# Patient Record
Sex: Male | Born: 1970 | Race: Black or African American | Hispanic: No | Marital: Single | Smoking: Current some day smoker
Health system: Southern US, Community
[De-identification: ages and names within clinical notes are randomized; demographics above are authoritative.]

## PROBLEM LIST (undated history)

## (undated) DIAGNOSIS — M549 Dorsalgia, unspecified: Secondary | ICD-10-CM

## (undated) DIAGNOSIS — G8929 Other chronic pain: Secondary | ICD-10-CM

---

## 1998-01-22 ENCOUNTER — Emergency Department (HOSPITAL_COMMUNITY): Admission: EM | Admit: 1998-01-22 | Discharge: 1998-01-22 | Payer: Self-pay | Admitting: Emergency Medicine

## 1998-08-09 ENCOUNTER — Encounter: Payer: Self-pay | Admitting: Emergency Medicine

## 1998-08-09 ENCOUNTER — Emergency Department (HOSPITAL_COMMUNITY): Admission: EM | Admit: 1998-08-09 | Discharge: 1998-08-09 | Payer: Self-pay | Admitting: Emergency Medicine

## 2010-12-01 ENCOUNTER — Inpatient Hospital Stay (INDEPENDENT_AMBULATORY_CARE_PROVIDER_SITE_OTHER)
Admission: RE | Admit: 2010-12-01 | Discharge: 2010-12-01 | Disposition: A | Discharge status: Home / Self Care | Payer: Self-pay | Source: Ambulatory Visit | Attending: Family Medicine | Admitting: Family Medicine

## 2010-12-01 DIAGNOSIS — T6391XA Toxic effect of contact with unspecified venomous animal, accidental (unintentional), initial encounter: Secondary | ICD-10-CM

## 2013-06-20 ENCOUNTER — Ambulatory Visit: Payer: Self-pay

## 2017-01-08 ENCOUNTER — Ambulatory Visit (HOSPITAL_COMMUNITY)
Admission: EM | Admit: 2017-01-08 | Discharge: 2017-01-08 | Disposition: A | Discharge status: Home / Self Care | Payer: Self-pay | Attending: Family Medicine | Admitting: Family Medicine

## 2017-01-08 ENCOUNTER — Encounter (HOSPITAL_COMMUNITY): Payer: Self-pay | Admitting: *Deleted

## 2017-01-08 DIAGNOSIS — L0231 Cutaneous abscess of buttock: Secondary | ICD-10-CM | POA: Insufficient documentation

## 2017-01-08 DIAGNOSIS — L0291 Cutaneous abscess, unspecified: Secondary | ICD-10-CM

## 2017-01-08 MED ORDER — BACITRACIN ZINC 500 UNIT/GM EX OINT
TOPICAL_OINTMENT | CUTANEOUS | Status: AC
  Filled 2017-01-08: qty 0.9

## 2017-01-08 MED ORDER — HYDROCODONE-ACETAMINOPHEN 5-325 MG PO TABS
ORAL_TABLET | ORAL | Status: AC
  Filled 2017-01-08: qty 1

## 2017-01-08 MED ORDER — HYDROCODONE-ACETAMINOPHEN 5-325 MG PO TABS: 1.0000 | ORAL_TABLET | Freq: Four times a day (QID) | ORAL | 0 refills | Status: DC | PRN

## 2017-01-08 MED ORDER — HYDROCODONE-ACETAMINOPHEN 5-325 MG PO TABS
1.0000 | ORAL_TABLET | Freq: Once | ORAL | Status: AC
  Administered 2017-01-08: 1 via ORAL

## 2017-01-08 MED ORDER — CEFTRIAXONE SODIUM 1 G IJ SOLR
1.0000 g | Freq: Once | INTRAMUSCULAR | Status: AC
  Administered 2017-01-08: 1 g via INTRAMUSCULAR

## 2017-01-08 MED ORDER — CEFTRIAXONE SODIUM 1 G IJ SOLR
INTRAMUSCULAR | Status: AC
Start: 2017-01-08 — End: 2017-01-08
  Filled 2017-01-08: qty 10

## 2017-01-08 MED ORDER — CLINDAMYCIN HCL 300 MG PO CAPS: 300.0000 mg | ORAL_CAPSULE | Freq: Three times a day (TID) | ORAL | 0 refills | Status: DC

## 2017-01-08 MED ORDER — LIDOCAINE HCL (PF) 1 % IJ SOLN
INTRAMUSCULAR | Status: AC
  Filled 2017-01-08: qty 2

## 2017-01-08 NOTE — ED Provider Notes (Signed)
[facility]   [id number] 01/08/17 Arrival Time: 1417   SUBJECTIVE:  Gavin Cook is a 46 y.o. male who presents to the urgent care with complaint of a boil on the left cheek that is been present for 1 week and worsening. Describes his pain as an intense pressure, worse when he sits down. Denies any fever or chills, or other systemic symptoms      History reviewed. No pertinent past medical history. History reviewed. No pertinent family history. Social History   Social History  . Marital status: Single    Spouse name: N/A  . Number of children: N/A  . Years of education: N/A   Occupational History  . Not on file.   Social History Main Topics  . Smoking status: Current Some Day Smoker  . Smokeless tobacco: Never Used  . Alcohol use Yes  . Drug use: Unknown  . Sexual activity: Not on file   Other Topics Concern  . Not on file   Social History Narrative  . No narrative on file   No outpatient prescriptions have been marked as taking for the 01/08/17 encounter [facility] Encounter).   No Known Allergies    ROS: As per HPI, remainder of ROS negative.   OBJECTIVE:   Vitals:   01/08/17 1520  BP: (!) 170/105  Pulse: 78  Resp: 18  Temp: 98.6 F (37 C)  TempSrc: Oral  SpO2: 100%     General appearance: alert; no distress Eyes: PERRL; EOMI; conjunctiva normal HENT: normocephalic; atraumatic;  Neck: supple, No JVD noted Lungs: clear to auscultation bilaterally Heart: regular rate and rhythm Abdomen: soft, non-tender; Back: no CVA tenderness, approximately 2 cm abscess noted at the gluteal cleft, with induration noted. No surrounding erythema or cellulitis Extremities: no cyanosis or edema; symmetrical with no gross deformities Skin: warm and dry Neurologic: normal gait; grossly normal Psychological: alert and cooperative; normal mood and affect      Labs:  No results found for this or any previous visit.  Labs Reviewed    AEROBIC/ANAEROBIC CULTURE (SURGICAL/DEEP WOUND)    No results found.     ASSESSMENT & PLAN:  1. Abscess     Meds ordered this encounter  Medications  . HYDROcodone-acetaminophen (NORCO/VICODIN) 5-325 MG per tablet 1 tablet  . cefTRIAXone (ROCEPHIN) injection 1 g  . clindamycin (CLEOCIN) 300 MG capsule    Sig: Take 1 capsule (300 mg total) by mouth 3 (three) times daily.    Dispense:  21 capsule    Refill:  0    Order Specific Question:   Supervising Provider    Answer:   Mardella Layman [id number]  . HYDROcodone-acetaminophen (NORCO/VICODIN) 5-325 MG tablet    Sig: Take 1 tablet by mouth every 6 (six) hours as needed.    Dispense:  10 tablet    Refill:  0    Order Specific Question:   Supervising Provider    Answer:   Mardella Layman [[id number]]    Reviewed expectations re: course of current medical issues. Questions answered. Outlined signs and symptoms indicating need for more acute intervention. Patient verbalized understanding. After Visit Summary given.    Procedures:  Verbal consent obtained. Area over induration cleaned with betadine. Lidocaine 2% with epinephrine used to obtain local anesthesia. The most fluctuant portion of the abscess was incised with a #11 blade scalpel. Abscess cavity explored and evacuated. Loculations broken up with a curved hemostat as best as possible given patient discomfort. Cavity was not packed. It was dressed with  a clean gauze dressing. Minimal bleeding. No complications.  Wound care instructions discussed and given in written format.     Dorena BodoKennard, Omero Kowal, NP 01/08/17 1600

## 2017-01-08 NOTE — Discharge Instructions (Signed)
An incision and drainage is been done, and samples of been sent to the lab for further testing. If any changes in therapy need to be made, we will contact you with the results. I prescribed a medicine called clindamycin, take one tablet every 8 hours for one week. I have also prescribed a medicine for pain called hydrocodone, this medicine is a narcotic, it will cause drowsiness, and it is addictive. Do not take more than what is necessary, do not drink alcohol while taking, and do not operate any heavy machinery while taking this medicine. Keep your wound clean, and dry, covered, change or bandage at least once every day. Monitor for signs and symptoms of infection. The symptoms worsen, or fail to resolve, return to clinic as needed.

## 2017-01-08 NOTE — ED Triage Notes (Addendum)
Boil   On        l   Cheek       X   1   Week         painfull  To  Touch    X   1   Week    Getting  Worse  Pt  Reports  Difficulty    When  Sitting  Down

## 2017-01-14 LAB — AEROBIC/ANAEROBIC CULTURE W GRAM STAIN (SURGICAL/DEEP WOUND): Culture: NEGATIVE

## 2017-10-07 ENCOUNTER — Encounter (HOSPITAL_COMMUNITY): Payer: Self-pay | Admitting: Emergency Medicine

## 2017-10-07 ENCOUNTER — Emergency Department (HOSPITAL_COMMUNITY)
Admission: EM | Admit: 2017-10-07 | Discharge: 2017-10-08 | Disposition: A | Discharge status: Other Acute Inpatient Hospital | Payer: Self-pay | Attending: Emergency Medicine | Admitting: Emergency Medicine

## 2017-10-07 ENCOUNTER — Other Ambulatory Visit: Payer: Self-pay

## 2017-10-07 DIAGNOSIS — F17210 Nicotine dependence, cigarettes, uncomplicated: Secondary | ICD-10-CM | POA: Insufficient documentation

## 2017-10-07 DIAGNOSIS — F1494 Cocaine use, unspecified with cocaine-induced mood disorder: Secondary | ICD-10-CM | POA: Insufficient documentation

## 2017-10-07 DIAGNOSIS — Z79899 Other long term (current) drug therapy: Secondary | ICD-10-CM | POA: Insufficient documentation

## 2017-10-07 DIAGNOSIS — F1420 Cocaine dependence, uncomplicated: Secondary | ICD-10-CM

## 2017-10-07 DIAGNOSIS — F29 Unspecified psychosis not due to a substance or known physiological condition: Secondary | ICD-10-CM

## 2017-10-07 LAB — CBC WITH DIFFERENTIAL/PLATELET
Basophils Absolute: 0 10*3/uL (ref 0.0–0.1)
Basophils Relative: 0 %
EOS PCT: 1 %
Eosinophils Absolute: 0 10*3/uL (ref 0.0–0.7)
HCT: 42.1 % (ref 39.0–52.0)
Hemoglobin: 13.8 g/dL (ref 13.0–17.0)
LYMPHS ABS: 1 10*3/uL (ref 0.7–4.0)
LYMPHS PCT: 12 %
MCH: 25.4 pg — AB (ref 26.0–34.0)
MCHC: 32.8 g/dL (ref 30.0–36.0)
MCV: 77.5 fL — AB (ref 78.0–100.0)
MONO ABS: 1 10*3/uL (ref 0.1–1.0)
Monocytes Relative: 13 %
Neutro Abs: 5.6 10*3/uL (ref 1.7–7.7)
Neutrophils Relative %: 74 %
PLATELETS: 326 10*3/uL (ref 150–400)
RBC: 5.43 MIL/uL (ref 4.22–5.81)
RDW: 13.5 % (ref 11.5–15.5)
WBC: 7.7 10*3/uL (ref 4.0–10.5)

## 2017-10-07 LAB — BASIC METABOLIC PANEL
Anion gap: 9 (ref 5–15)
BUN: 13 mg/dL (ref 6–20)
CO2: 25 mmol/L (ref 22–32)
Calcium: 9.1 mg/dL (ref 8.9–10.3)
Chloride: 107 mmol/L (ref 101–111)
Creatinine, Ser: 1.48 mg/dL — ABNORMAL HIGH (ref 0.61–1.24)
GFR calc Af Amer: >60 mL/min (ref >60)
GFR, EST NON AFRICAN AMERICAN: 56 mL/min — AB (ref >60)
GLUCOSE: 121 mg/dL — AB (ref 65–99)
POTASSIUM: 3.7 mmol/L (ref 3.5–5.1)
Sodium: 141 mmol/L (ref 135–145)

## 2017-10-07 LAB — RAPID URINE DRUG SCREEN, HOSP PERFORMED
Amphetamines: NOT DETECTED
BENZODIAZEPINES: NOT DETECTED
Barbiturates: NOT DETECTED
COCAINE: POSITIVE — AB
Opiates: NOT DETECTED
TETRAHYDROCANNABINOL: NOT DETECTED

## 2017-10-07 LAB — ETHANOL: Alcohol, Ethyl (B): <10 mg/dL (ref <10)

## 2017-10-07 LAB — SALICYLATE LEVEL: Salicylate Lvl: <7 mg/dL (ref 2.8–30.0)

## 2017-10-07 LAB — ACETAMINOPHEN LEVEL

## 2017-10-07 MED ORDER — ACETAMINOPHEN 325 MG PO TABS
650.0000 mg | ORAL_TABLET | Freq: Once | ORAL | Status: AC
  Administered 2017-10-07: 650 mg via ORAL
  Filled 2017-10-07: qty 2

## 2017-10-07 MED ORDER — RISPERIDONE 0.5 MG PO TABS
0.5000 mg | ORAL_TABLET | Freq: Two times a day (BID) | ORAL | Status: DC
  Administered 2017-10-07 – 2017-10-08 (×4): 0.5 mg via ORAL
  Filled 2017-10-07 (×4): qty 1

## 2017-10-07 MED ORDER — GABAPENTIN 100 MG PO CAPS
200.0000 mg | ORAL_CAPSULE | Freq: Two times a day (BID) | ORAL | Status: DC
  Administered 2017-10-07 – 2017-10-08 (×4): 200 mg via ORAL
  Filled 2017-10-07 (×4): qty 2

## 2017-10-07 NOTE — ED Notes (Signed)
Watching tv, pt reports that back pain has improved, but is still 9/10
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
## 2017-10-07 NOTE — ED Notes (Signed)
[id number] Expected date:  Expected time:  Means of arrival:  Comments: Room 18

## 2017-10-07 NOTE — ED Notes (Signed)
Pt ambulatory w/o difficulty, to the bathroom on arrival.
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
## 2017-10-07 NOTE — ED Notes (Signed)
On the phone 

## 2017-10-07 NOTE — ED Notes (Signed)
Patient pleasant and cooperative with care this shift and is compliant with HS medication. Pt denies suicidal or homicidal ideations, but remains delusional and states that he was being followed earlier in the day and that the events he reported earlier were true. Pt with no distress noted at this time.
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
## 2017-10-07 NOTE — ED Notes (Signed)
Pt talking quietly w/ girlfriend, pt reports no change in back pain.  Pt is  aware that the want him to stay and see the psych in the AM, pt is in agreement.
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
## 2017-10-08 ENCOUNTER — Inpatient Hospital Stay (HOSPITAL_COMMUNITY)
Admission: AD | Admit: 2017-10-08 | Discharge: 2017-10-11 | DRG: 897 | Disposition: A | Discharge status: Home / Self Care | Payer: No Typology Code available for payment source | Source: Intra-hospital | Attending: Psychiatry | Admitting: Psychiatry

## 2017-10-08 ENCOUNTER — Encounter (HOSPITAL_COMMUNITY): Payer: Self-pay | Admitting: *Deleted

## 2017-10-08 ENCOUNTER — Other Ambulatory Visit: Payer: Self-pay

## 2017-10-08 DIAGNOSIS — M545 Low back pain: Secondary | ICD-10-CM | POA: Diagnosis present

## 2017-10-08 DIAGNOSIS — Z634 Disappearance and death of family member: Secondary | ICD-10-CM

## 2017-10-08 DIAGNOSIS — F329 Major depressive disorder, single episode, unspecified: Secondary | ICD-10-CM

## 2017-10-08 DIAGNOSIS — G8929 Other chronic pain: Secondary | ICD-10-CM | POA: Diagnosis present

## 2017-10-08 DIAGNOSIS — R450 Nervousness: Secondary | ICD-10-CM

## 2017-10-08 DIAGNOSIS — F419 Anxiety disorder, unspecified: Secondary | ICD-10-CM | POA: Diagnosis present

## 2017-10-08 DIAGNOSIS — Z811 Family history of alcohol abuse and dependence: Secondary | ICD-10-CM | POA: Diagnosis not present

## 2017-10-08 DIAGNOSIS — Z590 Homelessness: Secondary | ICD-10-CM | POA: Diagnosis not present

## 2017-10-08 DIAGNOSIS — F17210 Nicotine dependence, cigarettes, uncomplicated: Secondary | ICD-10-CM | POA: Diagnosis present

## 2017-10-08 DIAGNOSIS — F19950 Other psychoactive substance use, unspecified with psychoactive substance-induced psychotic disorder with delusions: Secondary | ICD-10-CM | POA: Diagnosis not present

## 2017-10-08 DIAGNOSIS — R454 Irritability and anger: Secondary | ICD-10-CM | POA: Diagnosis not present

## 2017-10-08 DIAGNOSIS — F14150 Cocaine abuse with cocaine-induced psychotic disorder with delusions: Secondary | ICD-10-CM | POA: Diagnosis present

## 2017-10-08 DIAGNOSIS — G4700 Insomnia, unspecified: Secondary | ICD-10-CM | POA: Diagnosis present

## 2017-10-08 DIAGNOSIS — Z720 Tobacco use: Secondary | ICD-10-CM

## 2017-10-08 DIAGNOSIS — F14250 Cocaine dependence with cocaine-induced psychotic disorder with delusions: Secondary | ICD-10-CM | POA: Diagnosis present

## 2017-10-08 DIAGNOSIS — R4587 Impulsiveness: Secondary | ICD-10-CM

## 2017-10-08 DIAGNOSIS — Z560 Unemployment, unspecified: Secondary | ICD-10-CM | POA: Diagnosis not present

## 2017-10-08 DIAGNOSIS — F23 Brief psychotic disorder: Secondary | ICD-10-CM

## 2017-10-08 DIAGNOSIS — F1099 Alcohol use, unspecified with unspecified alcohol-induced disorder: Secondary | ICD-10-CM

## 2017-10-08 MED ORDER — NICOTINE 21 MG/24HR TD PT24
21.0000 mg | MEDICATED_PATCH | Freq: Every day | TRANSDERMAL | Status: DC
  Administered 2017-10-09 – 2017-10-11 (×3): 21 mg via TRANSDERMAL
  Filled 2017-10-08 (×6): qty 1

## 2017-10-08 MED ORDER — GABAPENTIN 100 MG PO CAPS
200.0000 mg | ORAL_CAPSULE | Freq: Two times a day (BID) | ORAL | Status: DC
  Administered 2017-10-09 – 2017-10-11 (×5): 200 mg via ORAL
  Filled 2017-10-08 (×3): qty 28
  Filled 2017-10-08 (×3): qty 2
  Filled 2017-10-08: qty 28
  Filled 2017-10-08 (×4): qty 2

## 2017-10-08 MED ORDER — ACETAMINOPHEN 325 MG PO TABS
650.0000 mg | ORAL_TABLET | Freq: Four times a day (QID) | ORAL | Status: DC | PRN
  Administered 2017-10-08 – 2017-10-10 (×2): 650 mg via ORAL
  Filled 2017-10-08 (×2): qty 2

## 2017-10-08 MED ORDER — MAGNESIUM HYDROXIDE 400 MG/5ML PO SUSP: 30.0000 mL | Freq: Every day | ORAL | Status: DC | PRN

## 2017-10-08 MED ORDER — ALUM & MAG HYDROXIDE-SIMETH 200-200-20 MG/5ML PO SUSP: 30.0000 mL | ORAL | Status: DC | PRN

## 2017-10-08 MED ORDER — HYDROXYZINE HCL 25 MG PO TABS
25.0000 mg | ORAL_TABLET | Freq: Three times a day (TID) | ORAL | Status: DC | PRN
  Administered 2017-10-08: 25 mg via ORAL
  Filled 2017-10-08: qty 1
  Filled 2017-10-08: qty 10

## 2017-10-08 MED ORDER — TRAZODONE HCL 50 MG PO TABS
50.0000 mg | ORAL_TABLET | Freq: Every evening | ORAL | Status: DC | PRN
  Filled 2017-10-08: qty 7

## 2017-10-08 MED ORDER — RISPERIDONE 0.5 MG PO TABS
0.5000 mg | ORAL_TABLET | Freq: Two times a day (BID) | ORAL | Status: DC
  Administered 2017-10-09 – 2017-10-10 (×3): 0.5 mg via ORAL
  Filled 2017-10-08 (×7): qty 1

## 2017-10-08 NOTE — ED Notes (Signed)
Pt talking on hallway phone.  
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
## 2017-10-08 NOTE — ED Notes (Signed)
Pt A&O x 3, no distress noted, calm & cooperative, interactive with staff.  Visiting with girlfriend at present.  Monitoring for safety, Q 15 min checks in effect.  [facility].

## 2017-10-08 NOTE — ED Notes (Signed)
[id number].  Pending [facility] transport.

## 2017-10-08 NOTE — Tx Team (Signed)
Initial Treatment Plan 10/08/2017 11:42 PM Gavin Cook ZOX:[id number]    PATIENT STRESSORS: Financial difficulties Loss of family members Marital or family conflict Substance abuse   PATIENT STRENGTHS: Ability for insight Average or above average intelligence Capable of independent living Wellsite geologistCommunication skills General fund of knowledge   PATIENT IDENTIFIED PROBLEMS: Substance Abuse Depression Grieving "I been dealing with death, I don't have nobody in my family left"                     DISCHARGE CRITERIA:  Ability to meet basic life and health needs Improved stabilization in mood, thinking, and/or behavior Verbal commitment to aftercare and medication compliance  PRELIMINARY DISCHARGE PLAN: Attend aftercare/continuing care group Return to previous living arrangement  PATIENT/FAMILY INVOLVEMENT: This treatment plan has been presented to and reviewed with the patient, Gavin Cook, and/or family member, .  The patient and family have been given the opportunity to ask questions and make suggestions.  Gavin Cook, Gavin Cook, [place]RN 10/08/2017, 11:42 PM

## 2017-10-08 NOTE — ED Notes (Signed)
Peer support at bedside 

## 2017-10-08 NOTE — BH Assessment (Signed)
[facility] Progress Note  Per Juanetta BeetsJacqueline Norman, DO, this pt requires psychiatric hospitalization at this time.  Berneice Heinrichina Tate, RN, [facility]; [facility] will be ready to receive pt at 18:00.  Pt has reluctantly signed Voluntary Admission and Consent for Treatment, as well as Consent to Release Information to pt's fiancee, and signed forms have [facility].  Pt's nurse, Morrie Sheldonshley, has been notified, and agrees to send original paperwork along with pt via [facility], and to call report to [phone].  Doylene Canninghomas Sanaii Caporaso, [place] Behavioral Health Coordinator [phone]

## 2017-10-09 DIAGNOSIS — F419 Anxiety disorder, unspecified: Secondary | ICD-10-CM

## 2017-10-09 DIAGNOSIS — Z590 Homelessness: Secondary | ICD-10-CM

## 2017-10-09 DIAGNOSIS — F17210 Nicotine dependence, cigarettes, uncomplicated: Secondary | ICD-10-CM

## 2017-10-09 DIAGNOSIS — Z811 Family history of alcohol abuse and dependence: Secondary | ICD-10-CM

## 2017-10-09 DIAGNOSIS — F14150 Cocaine abuse with cocaine-induced psychotic disorder with delusions: Secondary | ICD-10-CM

## 2017-10-09 DIAGNOSIS — G4700 Insomnia, unspecified: Secondary | ICD-10-CM

## 2017-10-09 DIAGNOSIS — F1099 Alcohol use, unspecified with unspecified alcohol-induced disorder: Secondary | ICD-10-CM

## 2017-10-09 DIAGNOSIS — Z560 Unemployment, unspecified: Secondary | ICD-10-CM

## 2017-10-09 DIAGNOSIS — R454 Irritability and anger: Secondary | ICD-10-CM

## 2017-10-09 DIAGNOSIS — Z599 Problem related to housing and economic circumstances, unspecified: Secondary | ICD-10-CM

## 2017-10-09 NOTE — [facility] Suicide Risk Assessment (Signed)
[facility] INPATIENT:  Family/Significant Other Suicide Prevention Education  Suicide Prevention Education:  Education Completed; Gavin FuchsMaureen Cook (pt's fiance) [phone] has been identified by the patient as the family member/significant other with whom the patient will be residing, and identified as the person(s) who will aid the patient in the event of a mental health crisis (suicidal ideations/suicide attempt).  With written consent from the patient, the family member/significant other has been provided the following suicide prevention education, prior to the and/or following the discharge of the patient.  The suicide prevention education provided includes the following:  Suicide risk factors  Suicide prevention and interventions  National Suicide Hotline telephone number  [facility] assessment telephone number  [facility] Emergency Assistance 911  [facility] and/or Residential Mobile Crisis Unit telephone number  Request made of family/significant other to:  Remove weapons (e.g., guns, rifles, knives), all items previously/currently identified as safety concern.    Remove drugs/medications (over-the-counter, prescriptions, illicit drugs), all items previously/currently identified as a safety concern.  The family member/significant other verbalizes understanding of the suicide prevention education information provided.  The family member/significant other agrees to remove the items of safety concern listed above.   SPE and aftercare reviewed with pt's fiance. She will remove all sharps/knives from pt's belongings. Pt does not have access to firearms. Pt's fiance plans to visit with him this evening and will report any concerns to CSW if any. She is [facility]able [facility] and [facility].   Rona RavensHeather S Mika Anastasi LCSW 10/09/2017, 3:12 PM
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
## 2017-10-09 NOTE — Plan of Care (Signed)
Patient sleeping upon approach. Later on, patient was given breakfast tray and ate in his room. Denies physical pain, denies SI/HI/AVH. Affect bright and pleasant, mood appropriate. Patient did not fill out a self inventory. Patient compliant with medication administration. No questions or concerns about medications. Safety maintained with 15 minute checks. Will continue to monitor.  Problem: Activity: Goal: Interest or engagement in activities will improve Outcome: Progressing   Problem: Coping: Goal: Ability to demonstrate self-control will improve Outcome: Progressing
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
## 2017-10-09 NOTE — BHH Counselor (Signed)
Adult Comprehensive Assessment  Patient ID: Gavin Cook, male   DOB: [birth year], 47 y.o.   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 most family member have died Financial / Lack of resources (include bankruptcy): recent cash influx due to sister dying and leaving him money Housing / Lack of housing: homeless and living in car and motels "when I can afford to get a room." Physical health (include injuries & life threatening diseases): none identified Social relationships: fair-few friends in the community; 3 kids that "I don't like to see when I'm not doing well." fiance Substance abuse: crack cocaine since 47yrs old. "I use it to cope but I can stop if I put my mind to it and that's what I'm planning to do." Bereavement / Loss: mother died several years ago on my birthday so birthdays are hard for me. sister recently passed away "I was very close to her."   Living/Environment/Situation:  Living Arrangements: Spouse/significant other Living conditions (as described by patient or guardian): homeless with fiance. "we stay in the car, on the street or in motels depending on the day." Who else lives in the home?: fiance How long has patient lived in current situation?: several months  What is atmosphere in current home: Temporary, Chaotic  Family History:  Marital status: Long term relationship Long term relationship, how long?: engaged to fiance of 2 1/2 years What types of issues is patient dealing with in  the relationship?: homelessness and financial hardship Additional relationship information: [name], broadcasting/film/video toughing it out with me."  Are you sexually active?: Yes What is your sexual orientation?: heterosexual Has your sexual activity been affected by drugs, alcohol, medication, or emotional stress?: n/a  Does patient have children?: Yes How many children?: 3 How is patient's relationship with their children?: 10, 16, and 59 yo kids. "They are with their grandma. I don't see them as much when I'm not doing well. I don't want them to see me like this."   Childhood History:  By whom was/is the patient raised?: Mother Additional childhood history information: mother was primary caretaker. biological father 'was never around.' Description of patient's relationship with caregiver when they were a child: close to mother "She was my best friend." Patient's description of current relationship with people who raised him/her: mother deceased. no relationship with father How were you disciplined when you got in trouble as a child/adolescent?: n/a  Does patient have siblings?: No Did patient suffer any verbal/emotional/physical/sexual abuse as a child?: No Did patient suffer from severe childhood neglect?: No Has patient ever been sexually abused/assaulted/raped as an adolescent or adult?: No Was the patient ever a victim of a crime or a disaster?: No Witnessed domestic violence?: No Has patient been effected by domestic violence as an adult?: No  Education:  Highest grade of school patient has completed: high school  Currently a Consulting civil engineer?: No Learning disability?: No  Employment/Work Situation:   Employment situation: Unemployed Patient's job has been impacted by current illness: Yes Describe how patient's job has been impacted: unable to hold down employment due to mood  lability and crack abuse What is the longest time patient has a held a job?: few months Where was the patient employed at that time?:  [employer]  Did You Receive Any Psychiatric Treatment/Services While in the [employer]?: No(no [employer] and Companymilitary experience) Are There Guns or Other Weapons in Your Home?: No Are These Weapons Safely Secured?: (n/a)  Financial Resources:   Financial resources: No income Does patient have a Lawyerrepresentative payee or guardian?: No  Alcohol/Substance Abuse:   What has been your use of drugs/alcohol within the last 12 months?: crack cocaine abuse since age 47. "I use to cope with my life but I can stop if I want to and I want to." pt denies other drug or alcohol abuse.  If attempted suicide, did drugs/alcohol play a role in this?: No Alcohol/Substance Abuse Treatment Hx: Denies past history If yes, describe treatment: n/a  Has alcohol/substance abuse ever caused legal problems?: No  Social Support System:   Patient's Community Support System: Fair Museum/gallery exhibitions officerDescribe Community Support System: few friends; fiance is biggest support.  Type of faith/religion: christian How does patient's faith help to cope with current illness?: prayer "I feel like God is wanting me to get on the right path."   Leisure/Recreation:   Leisure and Hobbies: video games; sports; bowling  Strengths/Needs:   What is the patient's perception of their strengths?: "My motivation to get off crack and start fresh. I want to marry my fiance soon." Patient states they can use these personal strengths during their treatment to contribute to their recovery: "follow-up and follow-through" Patient states these barriers may affect/interfere with their treatment: no income currently; homeless Patient states these barriers may affect their return to the community: n/a  Other important information patient would like considered in planning for their treatment: n/a   Discharge Plan:   Currently receiving community mental health services: No Patient states concerns and preferences for aftercare planning are: no income and no insurance Patient states they  will know when they are safe and ready for discharge when: "when I'm back on my meds."  Does patient have access to transportation?: Yes(car) Does patient have financial barriers related to discharge medications?: Yes Patient description of barriers related to discharge medications: no income/no insurance  Will patient be returning to same living situation after discharge?: Yes(back with fiance. "I just inherited money from my sister and plan to go to the beach for my birthday." )  Summary/Recommendations:   Summary and Recommendations (to be completed by the evaluator): Patient is 47yo male [place] Patient presents to the hosptial due to bizarre behavior, paranoia, and delusions. Patient reports crack cocaine abuse (chronic use). He currently denies SI/HI/AVH. Patient is currently unemployed, engaged and has 3 children in care of various family members. Patient has a primary diagnosis of Substance induced mood disorder. Recommendations for patient include: crisis stabilization, therapeutic milieu, encourage group attendance and participation, medication management for detox/mood stabilization, and development of comprehensive mental wellness/sobriety plan. CSW assessing for appropraite referrals.   Rona RavensHeather S Shanan Mcmiller LCSW 10/09/2017 2:49 PM
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
## 2017-10-10 DIAGNOSIS — F19950 Other psychoactive substance use, unspecified with psychoactive substance-induced psychotic disorder with delusions: Secondary | ICD-10-CM

## 2017-10-10 LAB — LIPID PANEL
CHOL/HDL RATIO: 2.5 ratio
CHOLESTEROL: 140 mg/dL (ref 0–200)
HDL: 56 mg/dL (ref >40)
LDL Cholesterol: 75 mg/dL (ref 0–99)
Triglycerides: 43 mg/dL (ref <150)
VLDL: 9 mg/dL (ref 0–40)

## 2017-10-10 LAB — HEMOGLOBIN A1C
Hgb A1c MFr Bld: 4.3 % — ABNORMAL LOW (ref 4.8–5.6)
MEAN PLASMA GLUCOSE: 76.71 mg/dL

## 2017-10-10 MED ORDER — IBUPROFEN 600 MG PO TABS
600.0000 mg | ORAL_TABLET | Freq: Four times a day (QID) | ORAL | Status: DC | PRN
  Administered 2017-10-10 – 2017-10-11 (×2): 600 mg via ORAL
  Filled 2017-10-10: qty 1

## 2017-10-10 MED ORDER — RISPERIDONE 1 MG PO TABS
1.0000 mg | ORAL_TABLET | Freq: Every day | ORAL | Status: DC
  Administered 2017-10-10: 1 mg via ORAL
  Filled 2017-10-10: qty 7
  Filled 2017-10-10 (×2): qty 1
  Filled 2017-10-10: qty 7

## 2017-10-10 NOTE — Progress Notes (Signed)
Recreation Therapy Notes  INPATIENT RECREATION THERAPY ASSESSMENT  Patient Details Name: Gavin Cook MRN: [id number] DOB: 04/08/1971 Today's Date: 10/10/2017       Information Obtained From: Patient  Able to Participate in Assessment/Interview: Yes  Patient Presentation: Alert, Oriented  Reason for Admission (Per Patient): Substance Abuse  Patient Stressors: Other (Comment)(The streets; peer pressure)  Coping Skills:   Write, Sports, TV, Music, Exercise, Meditate, Art, Prayer, Avoidance, Read, Hot Bath/Shower  Leisure Interests (2+):  ConocoPhillipsature - Fishing, Exercise - Walking, Social - Family, Art - Draw, Garment/textile technologistCommunity - Other (Comment)(Do characters at birthday parties)  Frequency of Recreation/Participation: Other (Comment)(Daily)  Awareness of Community Resources:  Yes  Community Resources:  Park, [place], Engineering geologistLibrary  Current Use: Yes  If no, Barriers?:    Expressed Interest in State Street CorporationCommunity Resource Information: No  Enbridge EnergyCounty of Residence:  [place]  Patient Main Form of Transportation: Set designerCar  Patient Strengths:  Lively social person; like to see people smile  Patient Identified Areas of Improvement:  Caring for self  Patient Goal for Hospitalization:  "Stay focused and maintain, secure self before leaving"  Current SI (including self-harm):  No  Current HI:  No  Current AVH: No  Staff Intervention Plan: Group Attendance, Collaborate with Interdisciplinary Treatment Team  Consent to Intern Participation: N/A   Caroll RancherMarjette Nevyn Bossman, LRT/CTRS  Caroll RancherLindsay, Dana Debo A 10/10/2017, 3:19 PM
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
## 2017-10-10 NOTE — Progress Notes (Signed)
DAR NOTE: Pt present with calm affect and bright mood in the unit. Pt has been visible in the dayroom interacting with peers . Pt complained of back pain, took all his meds as scheduled. As per self inventory, pt had a good night sleep, good appetite, normal energy, and good concentration. Pt rate depression at 0, hopeless ness at 0. Pt's safety ensured with 15 minute and environmental checks. Pt currently denies SI/HI and A/V hallucinations. Pt verbally agrees to seek staff if SI/HI or A/VH occurs and to consult with staff before acting on these thoughts. Will continue POC.

## 2017-10-10 NOTE — Progress Notes (Signed)
Recreation Therapy Notes  Date: 6.12.19 Time: 1000 Location: 500 Hall Dayroom  Group Topic: Communication, Team Building, Problem Solving  Goal Area(s) Addresses:  Patient will effectively work with peer towards shared goal.  Patient will identify skill used to make activity successful.  Patient will identify how skills used during activity can be used to reach post d/c goals.   Behavioral Response: Engaged  Intervention: STEM Activity   Activity: In team's, using 20 plastic straws and a long piece of masking tape, patients were to construct a stand alone bridge that could hold the weight of a small puzzle box.   Education: Pharmacist, communityocial Skills, Building control surveyorDischarge Planning.   Education Outcome: Acknowledges education/In group clarification offered/Needs additional education.   Clinical Observations/Feedback: Pt was bright and engaged.  Pt stated the group had to use their "brain and fingers" to untangle the tape.  Pt stated the support system was like the tape because "they hold you together".  Pt also emphasized that "everyone has a part to play" in helping you.     Caroll RancherMarjette Lyrick Worland, LRT/CTRS    Caroll RancherLindsay, Achaia Garlock A 10/10/2017 12:39 PM

## 2017-10-10 NOTE — [facility] Group Notes (Signed)
LCSW Group Therapy Note   10/10/2017 1:15pm   Type of Therapy and Topic:  Group Therapy:  Overcoming Obstacles   Participation Level:  Active   Description of Group:    In this group patients will be encouraged to explore what they see as obstacles to their own wellness and recovery. They will be guided to discuss their thoughts, feelings, and behaviors related to these obstacles. The group will process together ways to cope with barriers, with attention given to specific choices patients can make. Each patient will be challenged to identify changes they are motivated to make in order to overcome their obstacles. This group will be process-oriented, with patients participating in exploration of their own experiences as well as giving and receiving support and challenge from other group members.   Therapeutic Goals: 1. Patient will identify personal and current obstacles as they relate to admission. 2. Patient will identify barriers that currently interfere with their wellness or overcoming obstacles.  3. Patient will identify feelings, thought process and behaviors related to these barriers. 4. Patient will identify two changes they are willing to make to overcome these obstacles:      Summary of Patient Progress   Stayed the entire time, engaged throughout.  Stated he is his biggest oobstacle "when I am not open to new learning, when I think I know it all, when I reject other possibilities."  Spoke at length about the influence of his family; grandfather, father, aunt, and how the family is there for each other through thick and thin.    Therapeutic Modalities:   Cognitive Behavioral Therapy Solution Focused Therapy Motivational Interviewing Relapse Prevention Therapy  Gavin RogueRodney B Britiany Silbernagel, LCSW 10/10/2017 3:59 PM

## 2017-10-10 NOTE — Tx Team (Signed)
Interdisciplinary Treatment and Diagnostic Plan Update  10/10/2017 Time of Session: 8:35 AM  Gavin Cook MRN: [id number]  Principal Diagnosis: <principal problem not specified>  Secondary Diagnoses: Active Problems:   Psychosis ([place])   Current Medications:  Current Facility-Administered Medications  Medication Dose Route Frequency Provider Last Rate Last Dose  . acetaminophen (TYLENOL) tablet 650 mg  650 mg Oral Q6H PRN Ethelene Hal, NP   650 mg at 10/10/17 0752  . alum & mag hydroxide-simeth (MAALOX/MYLANTA) 200-200-20 MG/5ML suspension 30 mL  30 mL Oral Q4H PRN Ethelene Hal, NP      . gabapentin (NEURONTIN) capsule 200 mg  200 mg Oral BID Ethelene Hal, NP   200 mg at 10/10/17 8916  . hydrOXYzine (ATARAX/VISTARIL) tablet 25 mg  25 mg Oral TID PRN Ethelene Hal, NP   25 mg at 10/08/17 2307  . magnesium hydroxide (MILK OF MAGNESIA) suspension 30 mL  30 mL Oral Daily PRN Ethelene Hal, NP      . nicotine (NICODERM CQ - dosed in mg/24 hours) patch 21 mg  21 mg Transdermal Daily Cobos, Myer Peer, MD   21 mg at 10/10/17 0754  . risperiDONE (RISPERDAL) tablet 0.5 mg  0.5 mg Oral BID Ethelene Hal, NP   0.5 mg at 10/10/17 0758  . traZODone (DESYREL) tablet 50 mg  50 mg Oral QHS PRN Ethelene Hal, NP        PTA Medications: Medications Prior to Admission  Medication Sig Dispense Refill Last Dose  . clindamycin (CLEOCIN) 300 MG capsule Take 1 capsule (300 mg total) by mouth 3 (three) times daily. (Patient not taking: Reported on 10/07/2017) 21 capsule 0 Not Taking at Unknown time  . HYDROcodone-acetaminophen (NORCO/VICODIN) 5-325 MG tablet Take 1 tablet by mouth every 6 (six) hours as needed. (Patient not taking: Reported on 10/07/2017) 10 tablet 0 Not Taking at Unknown time    Patient Stressors: Financial difficulties Loss of family members Marital or family conflict Substance abuse  Patient Strengths: Ability for  insight Average or above average intelligence Capable of independent living Curator fund of knowledge  Treatment Modalities: Medication Management, Group therapy, Case management,  1 to 1 session with clinician, Psychoeducation, Recreational therapy.   Physician Treatment Plan for Primary Diagnosis: <principal problem not specified> Long Term Goal(s): Improvement in symptoms so as ready for discharge  Short Term Goals: Ability to identify triggers associated with substance abuse/mental health issues will improve Ability to identify changes in lifestyle to reduce recurrence of condition will improve Ability to maintain clinical measurements within normal limits will improve  Medication Management: Evaluate patient's response, side effects, and tolerance of medication regimen.  Therapeutic Interventions: 1 to 1 sessions, Unit Group sessions and Medication administration.  Evaluation of Outcomes: Progressing  Physician Treatment Plan for Secondary Diagnosis: Active Problems:   Psychosis ([place])   Long Term Goal(s): Improvement in symptoms so as ready for discharge  Short Term Goals: Ability to identify triggers associated with substance abuse/mental health issues will improve Ability to identify changes in lifestyle to reduce recurrence of condition will improve Ability to maintain clinical measurements within normal limits will improve  Medication Management: Evaluate patient's response, side effects, and tolerance of medication regimen.  Therapeutic Interventions: 1 to 1 sessions, Unit Group sessions and Medication administration.  Evaluation of Outcomes: Progressing   RN Treatment Plan for Primary Diagnosis: <principal problem not specified> Long Term Goal(s): Knowledge of disease and therapeutic regimen to maintain health will improve  Short Term Goals: Ability to identify and develop effective coping behaviors will improve and Compliance with prescribed  medications will improve  Medication Management: RN will administer medications as ordered by provider, will assess and evaluate patient's response and provide education to patient for prescribed medication. RN will report any adverse and/or side effects to prescribing provider.  Therapeutic Interventions: 1 on 1 counseling sessions, Psychoeducation, Medication administration, Evaluate responses to treatment, Monitor vital signs and CBGs as ordered, Perform/monitor CIWA, COWS, AIMS and Fall Risk screenings as ordered, Perform wound care treatments as ordered.  Evaluation of Outcomes: Progressing   LCSW Treatment Plan for Primary Diagnosis: <principal problem not specified> Long Term Goal(s): Safe transition to appropriate next level of care at discharge, Engage patient in therapeutic group addressing interpersonal concerns.  Short Term Goals: Engage patient in aftercare planning with referrals and resources  Therapeutic Interventions: Assess for all discharge needs, 1 to 1 time with Social worker, Explore available resources and support systems, Assess for adequacy in community support network, Educate family and significant other(s) on suicide prevention, Complete Psychosocial Assessment, Interpersonal group therapy.  Evaluation of Outcomes: Met   Progress in Treatment: Attending groups: Yes Participating in groups: Yes Taking medication as prescribed: Yes Toleration medication: Yes, no side effects reported at this time Family/Significant other contact made: Yes Patient understands diagnosis: Yes AEB Discussing patient identified problems/goals with staff: Yes Medical problems stabilized or resolved: Yes Denies suicidal/homicidal ideation: Yes Issues/concerns per patient self-inventory: None Other: N/A  New problem(s) identified: None identified at this time.   New Short Term/Long Term Goal(s): "I wanted help with my stress level, but now it is completely gone."   Discharge Plan  or Barriers:   Reason for Continuation of Hospitalization: Paranoia Delusions  Medication stabilization  Estimated Length of Stay: 6/17  Attendees: Patient: Gavin Cook 10/10/2017  8:35 AM  Physician: Maris Berger, MD 10/10/2017  8:35 AM  Nursing: Elesa Massed RN 10/10/2017  8:35 AM  RN Care Manager: Lars Pinks, RN 10/10/2017  8:35 AM  Social Worker: Ripley Fraise 10/10/2017  8:35 AM  Recreational Therapist: Winfield Cunas 10/10/2017  8:35 AM  Other: Norberto Sorenson 10/10/2017  8:35 AM  Other:  10/10/2017  8:35 AM    Scribe for Treatment Team:  Roque Lias LCSW 10/10/2017 8:35 AM

## 2017-10-10 NOTE — Progress Notes (Signed)
The focus of this group is to help patients review their daily goal of treatment and discuss progress on daily workbooks. Pt attended the evening group session and responded to all discussion prompts from the Writer. Pt shared that today was a good day on the unit, the highlight of which was learning about himself and feeling positivity on the unit.  Pt told that he is currently working on trying to learn about the medications he is on so that he can continue to take them upon discharge. He also mentioned feeling ready to discharge.  Pt requested towels from the Writer, but had no other requests from Nursing Staff at this time. His affect was appropriate.

## 2017-10-11 LAB — PROLACTIN: PROLACTIN: 46.4 ng/mL — AB (ref 4.0–15.2)

## 2017-10-11 MED ORDER — TRAZODONE HCL 50 MG PO TABS: 50.0000 mg | ORAL_TABLET | Freq: Every evening | ORAL | 0 refills | Status: DC | PRN

## 2017-10-11 MED ORDER — HYDROXYZINE HCL 25 MG PO TABS: 25.0000 mg | ORAL_TABLET | Freq: Three times a day (TID) | ORAL | 0 refills | Status: DC | PRN

## 2017-10-11 MED ORDER — NICOTINE 21 MG/24HR TD PT24: 21.0000 mg | MEDICATED_PATCH | Freq: Every day | TRANSDERMAL | 0 refills | Status: DC

## 2017-10-11 MED ORDER — RISPERIDONE 1 MG PO TABS: 1.0000 mg | ORAL_TABLET | Freq: Every day | ORAL | 0 refills | Status: DC

## 2017-10-11 MED ORDER — GABAPENTIN 100 MG PO CAPS: 200.0000 mg | ORAL_CAPSULE | Freq: Two times a day (BID) | ORAL | 0 refills | Status: DC

## 2017-10-11 NOTE — Progress Notes (Signed)
  [facility] Dis[facility] :  Will you be returning to the same living situation after discharge:  Yes,  home At discharge, do you have transportation home?: Yes,  wife Do you have the ability to pay for your medications: Yes,  mental health  Release of information consent forms completed and in the chart;  Patient's signature needed at discharge.  Patient to Follow up at: Follow-up Information    [facility].   Specialty:  Behavioral Health Why:  Hospital follow-up on Monday, 10/15/17 at 8:15AM. Please bring: photo ID, social security card, and any proof of income if you have it. Thank you.  Contact information: [address] [place] [address] [phone]           Next level of care provider has access to [facility] Link:no  Safety Planning and Suicide Prevention discussed: Yes,  yes  Have you used any form of tobacco in the last 30 days? (Cigarettes, Smokeless Tobacco, Cigars, and/or Pipes): Yes  Has patient been referred to the Quitline?: Patient refused referral  Patient has been referred for addiction treatment: Pt. refused referral  Ida RogueRodney B Lizania Bouchard, LCSW 10/11/2017, 9:13 AM

## 2017-10-11 NOTE — Plan of Care (Signed)
  Problem: Coping: Goal: Ability to demonstrate self-control will improve Outcome: Progressing Note:  Pt has maintained control of his behavior tonight.    D: Pt was in dayroom upon initial approach.  He presents with appropriate affect and mood.  He is pleasant and respectful towards staff and peers.  He describes his day as "fine, excellent."  His goal is to "get y'all's names for your superior work."  Pt denies SI/HI, hallucinations, and pain.  He has been visible in milieu and he attended evening group.  A: Introduced self to pt.  Actively listened to pt and provided support and encouragement.  Medication administered per order.  Medication education provided.  Q15 minute safety checks maintained.  R: Pt is compliant with medication.  He verbally contracts for safety and reports he will inform staff of needs and concerns.  Will continue to monitor and assess.

## 2017-10-11 NOTE — Progress Notes (Signed)
Pt discharged to lobby. Pt was stable and appreciative at that time. All papers, samples and prescriptions were given and valuables returned. Verbal understanding expressed. Denies SI/HI and A/VH. Pt given opportunity to express concerns and ask questions.  

## 2017-10-11 NOTE — Discharge Summary (Addendum)
Physician Discharge Summary Note  Patient:  Gavin Cook is an 47 y.o., male  MRN:  [id number]  DOB:  [birth year]  Patient phone:  [phone] (home)   Patient address:   [place] [address],  Total Time spent with patient: Greater than 30 minutes  Date of Admission:  10/08/2017  Date of Discharge: 10-11-17  Reason for Admission: Worsening symptoms of paranoia that he was being followed by people after cashing a check and disorganized/dangerous behaviors of brandishing a box cutter due to his paranoia which resulting in accidentally cutting his wife.  Principal Problem: Substance-induced psychotic disorder with delusions [facility])  Discharge Diagnoses: Patient Active Problem List   Diagnosis Date Noted  . 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 Cocaine use disorder, severe, dependence (HCC) [F14.20] 10/07/2017   Past Psychiatric History: See H&P  Past Medical History: History reviewed. No pertinent past medical history. History reviewed. No pertinent surgical history.  Family History: History reviewed. No pertinent family history.  Family Psychiatric  History: See H&P  Social History:  Social History   Substance and Sexual Activity  Alcohol Use Yes     Social History   Substance and Sexual Activity  Drug Use Yes  . Frequency: 1.0 times per week  . Types: Cocaine   Comment: +Coc    Social History   Socioeconomic History  . Marital status: Single    Spouse name: Not on file  . Number of children: Not on file  . Years of education: Not on file  . Highest education level: Not on file  Occupational History  . Not on file  Social Needs  . Financial resource strain: Not on file  . Food insecurity:    Worry: Not on file    Inability: Not on file  . Transportation needs:    Medical: Not on file    Non-medical: Not on file  Tobacco Use  . Smoking status: Current Some Day Smoker  . Smokeless tobacco: Never Used   Substance and Sexual Activity  . Alcohol use: Yes  . Drug use: Yes    Frequency: 1.0 times per week    Types: Cocaine    Comment: +Coc  . Sexual activity: Yes  Lifestyle  . Physical activity:    Days per week: Not on file    Minutes per session: Not on file  . Stress: Not on file  Relationships  . Social connections:    Talks on phone: Not on file    Gets together: Not on file    Attends religious service: Not on file    Active member of club or organization: Not on file    Attends meetings of clubs or organizations: Not on file    Relationship status: Not on file  Other Topics Concern  . Not on file  Social History Narrative  . Not on file   Hospital Course: (Per Md's discharge SRA): Gavin Cook is a 47 y/o M without formal psychiatric history who was admitted from ED with worsening symptoms of paranoia that he was being followed by people after cashing a check and disorganized/dangerous behaviors of brandishing a box cutter due to his paranoia which resulting in accidentally cutting his wife. Pt's symptoms were in the context of intoxication on cocaine. He was transferred to [facility] for additional treatment and evaluation. He was started on risperdal and dose was titrated up during his stay. He also was started on gabapentin and vistaril for anxiety as well as trazodone  for as needed treatment of sleep. Pt has been reporting incremental improvement of his presenting symptoms.  Today upon evaluation, pt shares, "I'm doing absolutely well and swell." Pt denies any specific concerns. His appetite is good. He is sleeping well. He denies other physical complaints. He denies SI/HI/AH/VH. He denies paranoia. He is tolerating his medications without difficulty or side effect. He is in agreement to continue his current regimen without changes. He is in agreement to have follow up at [facility]. Discussed with patient about risks of continued substance use of cocaine and ecstasy including risk  of damage to his heart to which pt reports his family has a genetic predisposition of heart disease, and pt verbalized good understanding. He states that he plans to avoid all illicit substance use after discharge. He was able to engage in safety planning including plan to return to [facility] or contact emergency services if he feels unable to maintain his own safety or the safety of others. Pt had no further questions, comments, or concerns.  Plan Of Care/Follow-up recommendations:   -Discharge to outpatient level of care as noted below.  -Substance-induced psychotic disorder with delusions (MDMA, cocaine) -Continue risperdal 1mg  po qhs  -Anxiety -Continue vistaril 25mg  po q8h prn anxiety -Continue gabapentin 200mg  po BID  -Insomnia -Continue trazodone 50mg  po qhs prn insomnia  Activity:  as tolerated  Physical Findings: AIMS: Facial and Oral Movements Muscles of Facial Expression: None, normal Lips and Perioral Area: None, normal Jaw: None, normal Tongue: None, normal,Extremity Movements Upper (arms, wrists, hands, fingers): None, normal Lower (legs, knees, ankles, toes): None, normal, Trunk Movements Neck, shoulders, hips: None, normal, Overall Severity Severity of abnormal movements (highest score from questions above): None, normal Incapacitation due to abnormal movements: None, normal Patient's awareness of abnormal movements (rate only patient's report): No Awareness, Dental Status Current problems with teeth and/or dentures?: No Does patient usually wear dentures?: No  CIWA:    COWS:     Musculoskeletal: Strength & Muscle Tone: within normal limits Gait & Station: normal Patient leans: N/A  Psychiatric Specialty Exam: Physical Exam  Constitutional: He appears well-developed.  HENT:  Head: Normocephalic.  Eyes: Pupils are equal, round, and reactive to light.  Neck: Normal range of motion.  Cardiovascular: Normal  rate.  Respiratory: Effort normal.  GI: Soft.  Genitourinary:  Genitourinary Comments: Deferred  Musculoskeletal: Normal range of motion.  Neurological: He is alert.  Skin: Skin is warm.    Review of Systems  Constitutional: Negative.   HENT: Negative.   Eyes: Negative.   Respiratory: Negative.   Cardiovascular: Negative.   Gastrointestinal: Negative.   Genitourinary: Negative.   Musculoskeletal: Negative.   Skin: Negative.   Neurological: Negative.   Endo/Heme/Allergies: Negative.   Psychiatric/Behavioral: Positive for depression (Stabilized with medication prior to discharge) and substance abuse (Hx, Cocaine use disorder (stable)). Negative for hallucinations, memory loss and suicidal ideas. The patient has insomnia (Stabilized with medication prior to discharge). The patient is not nervous/anxious.     Blood pressure 140/87, pulse 86, temperature 98.6 F (37 C), temperature source Oral, resp. rate 16, height 5\' 7"  (1.702 m), weight 62.6 kg (138 lb).Body mass index is 21.61 kg/m.  See Md's SRA   Have you used any form of tobacco in the last 30 days? (Cigarettes, Smokeless Tobacco, Cigars, and/or Pipes): Yes  Has this patient used any form of tobacco in the last 30 days? (Cigarettes, Smokeless Tobacco, Cigars, and/or Pipes): Yes, an FDA-approved tobacco cessation medication was offered at discharge.  Blood Alcohol level:  Lab Results  Component Value Date   ETH <10 10/07/2017   Metabolic Disorder Labs:  Lab Results  Component Value Date   HGBA1C 4.3 (L) 10/10/2017   MPG 76.71 10/10/2017   Lab Results  Component Value Date   PROLACTIN 46.4 (H) 10/10/2017   Lab Results  Component Value Date   CHOL 140 10/10/2017   TRIG 43 10/10/2017   HDL 56 10/10/2017   CHOLHDL 2.5 10/10/2017   VLDL 9 10/10/2017   LDLCALC 75 10/10/2017   See Psychiatric Specialty Exam and Suicide Risk Assessment completed by Attending Physician prior to discharge.  Discharge destination:   Home  Is patient on multiple antipsychotic therapies at discharge:  No   Has Patient had three or more failed trials of antipsychotic monotherapy by history:  No  Recommended Plan for Multiple Antipsychotic Therapies: NA  Allergies as of 10/11/2017   No Known Allergies     Medication List    STOP taking these medications   clindamycin 300 MG capsule Commonly known as:  CLEOCIN   HYDROcodone-acetaminophen 5-325 MG tablet Commonly known as:  NORCO/VICODIN     TAKE these medications     Indication  gabapentin 100 MG capsule Commonly known as:  NEURONTIN Take 2 capsules (200 mg total) by mouth 2 (two) times daily. For agitation  Indication:  Agitation   hydrOXYzine 25 MG tablet Commonly known as:  ATARAX/VISTARIL Take 1 tablet (25 mg total) by mouth 3 (three) times daily as needed for anxiety.  Indication:  Feeling Anxious   nicotine 21 mg/24hr patch Commonly known as:  NICODERM CQ - dosed in mg/24 hours Place 1 patch (21 mg total) onto the skin daily. (May buy from over the counter): For smoking cessation Start taking on:  10/12/2017  Indication:  Nicotine Addiction   risperiDONE 1 MG tablet Commonly known as:  RISPERDAL Take 1 tablet (1 mg total) by mouth at bedtime. For mood control  Indication:  Psychosis, Mood control   traZODone 50 MG tablet Commonly known as:  DESYREL Take 1 tablet (50 mg total) by mouth at bedtime as needed for sleep.  Indication:  Trouble Sleeping      Follow-up Information    [facility] Follow up.   Specialty:  Behavioral Health Why:  Hospital follow-up on Monday, 10/15/17 at 8:15AM. Please bring: photo ID, social security card, and any proof of income if you have it. Thank you.  Contact informationElpidio Eric: [address] [place] [address] [phone]          Follow-up recommendations: Activity:  As tolerated Diet: As recommended by your primary care doctor. Keep all scheduled follow-up appointments as recommended.   Comments:  Patient is instructed prior to discharge to: Take all medications as prescribed by his/her mental healthcare provider. Report any adverse effects and or reactions from the medicines to his/her outpatient provider promptly. Patient has been instructed & cautioned: To not engage in alcohol and or illegal drug use while on prescription medicines. In the event of worsening symptoms, patient is instructed to call the crisis hotline, 911 and or go to the nearest ED for appropriate evaluation and treatment of symptoms. To follow-up with his/her primary care provider for your other medical issues, concerns and or health care needs.   Signed: Armandina StammerAgnes Nwoko, NP, PMHNP, FNP-BC 10/11/2017, 9:33 AM   Patient seen, Suicide Assessment Completed.  Disposition Plan Reviewed

## 2017-10-11 NOTE — Plan of Care (Signed)
Pt was able to identify positive coping skills to use post d/c at completion of recreation therapy group sessions.   Zuhair Lariccia, LRT/CTRS 

## 2017-10-11 NOTE — [facility] Suicide Risk Assessment (Signed)
[facility]   Principal Problem: Substance-induced psychotic disorder with delusions [facility](HCC) Discharge Diagnoses:  Patient Active Problem List   Diagnosis Date Noted  . 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 Cocaine use disorder, severe, dependence (HCC) [F14.20] 10/07/2017    Total Time spent with patient: 30 minutes  Musculoskeletal: Strength & Muscle Tone: within normal limits Gait & Station: normal Patient leans: N/A  Psychiatric Specialty Exam: Review of Systems  Constitutional: Negative for chills and fever.  Respiratory: Negative for cough and shortness of breath.   Cardiovascular: Negative for chest pain.  Gastrointestinal: Negative for abdominal pain, heartburn, nausea and vomiting.  Psychiatric/Behavioral: Negative for depression, hallucinations and suicidal ideas. The patient is not nervous/anxious and does not have insomnia.     Blood pressure 140/87, pulse 86, temperature 98.6 F (37 C), temperature source Oral, resp. rate 16, height 5\' 7"  (1.702 m), weight 62.6 kg (138 lb).Body mass index is 21.61 kg/m.  General Appearance: Casual and Fairly Groomed  Patent attorneyye Contact::  Good  Speech:  Clear and Coherent and Normal Rate  Volume:  Normal  Mood:  Euthymic  Affect:  Appropriate and Congruent  Thought Process:  Coherent and Goal Directed  Orientation:  Full (Time, Place, and Person)  Thought Content:  Logical  Suicidal Thoughts:  No  Homicidal Thoughts:  No  Memory:  Immediate;   Fair Recent;   Fair Remote;   Fair  Judgement:  Fair  Insight:  Lacking  Psychomotor Activity:  Normal  Concentration:  Good  Recall:  FiservFair  Fund of Knowledge:Good  Language: Fair  Akathisia:  No  Handed:    AIMS (if indicated):     Assets:  Communication Skills Resilience Social Support  Sleep:  Number of Hours: 5  Cognition: WNL  ADL's:  Intact   Mental Status Per Nursing Assessment::   On Admission:  NA  Demographic  Factors:  Male and Low socioeconomic status  Loss Factors: Financial problems/change in socioeconomic status  Historical Factors: Impulsivity  Risk Reduction Factors:   Positive social support, Positive therapeutic relationship and Positive coping skills or problem solving skills  Continued Clinical Symptoms:  Severe Anxiety and/or Agitation Alcohol/Substance Abuse/Dependencies  Cognitive Features That Contribute To Risk:  None    Suicide Risk:  Minimal: No identifiable suicidal ideation.  Patients presenting with no risk factors but with morbid ruminations; may be classified as minimal risk based on the severity of the depressive symptoms  Follow-up Information    [facility] Follow up.   Specialty:  Behavioral Health Why:  Hospital follow-up on Monday, 10/15/17 at 8:15AM. Please bring: photo ID, social security card, and any proof of income if you have it. Thank you.  Contact information: [address] [place] [address] [phone]         Subjective Data:  Gavin Cook is a 47 y/o M without formal psychiatric history who was admitted from ED with worsening symptoms of paranoia that he was being followed by people after cashing a check and disorganized/dangerous behaviors of brandishing a box cutter due to his paranoia which resulting in accidentally cutting his wife. Pt's symptoms were in the context of intoxication on cocaine. [facility] He was started on risperdal and dose was titrated up during his stay. He also was started on gabapentin and vistaril for anxiety as well as trazodone for as needed treatment of sleep. Pt has been reporting incremental improvement of his  presenting symptoms.  Today upon evaluation, pt shares, "I'm doing absolutely well and swell." Pt denies any specific concerns. His appetite is good. He is sleeping well. He denies other physical complaints. He denies SI/HI/AH/VH. He denies  paranoia. He is tolerating his medications without difficulty or side effect. He is in agreement to continue his current regimen without changes. He is in agreement to have follow up at [facility]. Discussed with patient about risks of continued substance use of cocaine and ecstasy including risk of damage to his heart to which pt reports his family has a genetic predisposition of heart disease, and pt verbalized good understanding. He states that he plans to avoid all illicit substance use after discharge. He was able to engage in safety planning including plan to return to [facility] or contact emergency services if he feels unable to maintain his own safety or the safety of others. Pt had no further questions, comments, or concerns.   Plan Of Care/Follow-up recommendations:   -Discharge to outpatient level of care  -Substance-induced psychotic disorder with delusions (MDMA, cocaine)             -Continue risperdal 1mg  po qhs  -Anxiety             -Continue vistaril 25mg  po q8h prn anxiety             -Continue gabapentin 200mg  po BID  -Insomnia              -Continue trazodone 50mg  po qhs prn insomnia  Activity:  as tolerated Diet:  normal Tests:  NA Other:  see above for DC plan  Micheal Likens, MD 10/11/2017, 8:47 AM

## 2017-10-11 NOTE — Progress Notes (Signed)
Recreation Therapy Notes  Date: 6.13.19 Time: 1000 Location: 500 [address] Dayroom  Group Topic: Coping Skills  Goal Area(s) Addresses:  Patient will be able to identify positive coping skills. Patient will be able to identify benefits of using coping skills post d/c.  Behavioral Response: Engaged  Intervention: Mind map    Activity: Mind map.  LRT and patients filled in the first eight boxes of the mind map (job, pain, impulses, depression, transportation, medication, anger/aggression and family) together.  Patients then identified coping skills for each situation individually before reconvening as a group.  Education: PharmacologistCoping Skills, Building control surveyorDischarge Planning.   Education Outcome: Acknowledges understanding/In group clarification offered/Needs additional education.   Clinical Observations/Feedback: Pt was active and engaged in group.  Some of the coping skills pt identified were drinking water and the spa for pain; exercise for impulses; call the doctor and stay stable for depression; get a bike or scooter for transportation; meditation for medication; cry for anger/aggression; hugs and occupy kids for family and strive for promotions for job.     Caroll RancherMarjette Airi Copado, LRT/CTRS    Lillia AbedLindsay, Rowyn Spilde A 10/11/2017 10:57 AM

## 2017-12-23 ENCOUNTER — Encounter (HOSPITAL_COMMUNITY)
Admission: EM | Disposition: A | Discharge status: Home / Self Care | Payer: Self-pay | Source: Home / Self Care | Attending: Emergency Medicine

## 2017-12-23 ENCOUNTER — Emergency Department (HOSPITAL_COMMUNITY): Payer: No Typology Code available for payment source | Admitting: Anesthesiology

## 2017-12-23 ENCOUNTER — Emergency Department (HOSPITAL_COMMUNITY): Payer: No Typology Code available for payment source

## 2017-12-23 ENCOUNTER — Observation Stay (HOSPITAL_COMMUNITY)
Admission: EM | Admit: 2017-12-23 | Discharge: 2017-12-25 | Disposition: A | Discharge status: Home / Self Care | Payer: No Typology Code available for payment source | Attending: Otolaryngology | Admitting: Otolaryngology

## 2017-12-23 ENCOUNTER — Encounter (HOSPITAL_COMMUNITY): Payer: Self-pay | Admitting: Emergency Medicine

## 2017-12-23 DIAGNOSIS — S0240CA Maxillary fracture, right side, initial encounter for closed fracture: Secondary | ICD-10-CM

## 2017-12-23 DIAGNOSIS — F17200 Nicotine dependence, unspecified, uncomplicated: Secondary | ICD-10-CM | POA: Diagnosis not present

## 2017-12-23 DIAGNOSIS — S01511A Laceration without foreign body of lip, initial encounter: Principal | ICD-10-CM

## 2017-12-23 DIAGNOSIS — S0121XA Laceration without foreign body of nose, initial encounter: Secondary | ICD-10-CM | POA: Insufficient documentation

## 2017-12-23 DIAGNOSIS — S01411A Laceration without foreign body of right cheek and temporomandibular area, initial encounter: Secondary | ICD-10-CM | POA: Insufficient documentation

## 2017-12-23 DIAGNOSIS — S0181XA Laceration without foreign body of other part of head, initial encounter: Secondary | ICD-10-CM

## 2017-12-23 DIAGNOSIS — S129XXA Fracture of neck, unspecified, initial encounter: Secondary | ICD-10-CM

## 2017-12-23 DIAGNOSIS — S12600A Unspecified displaced fracture of seventh cervical vertebra, initial encounter for closed fracture: Secondary | ICD-10-CM | POA: Insufficient documentation

## 2017-12-23 HISTORY — DX: Dorsalgia, unspecified: M54.9

## 2017-12-23 HISTORY — PX: FACIAL LACERATION REPAIR: SHX6589

## 2017-12-23 HISTORY — DX: Other chronic pain: G89.29

## 2017-12-23 LAB — BASIC METABOLIC PANEL
ANION GAP: 12 (ref 5–15)
BUN: 9 mg/dL (ref 6–20)
CALCIUM: 8.5 mg/dL — AB (ref 8.9–10.3)
CO2: 24 mmol/L (ref 22–32)
Chloride: 106 mmol/L (ref 98–111)
Creatinine, Ser: 1.35 mg/dL — ABNORMAL HIGH (ref 0.61–1.24)
Glucose, Bld: 84 mg/dL (ref 70–99)
POTASSIUM: 3.7 mmol/L (ref 3.5–5.1)
SODIUM: 142 mmol/L (ref 135–145)

## 2017-12-23 LAB — CBC
HEMATOCRIT: 42.2 % (ref 39.0–52.0)
HEMOGLOBIN: 13.2 g/dL (ref 13.0–17.0)
MCH: 24.6 pg — ABNORMAL LOW (ref 26.0–34.0)
MCHC: 31.3 g/dL (ref 30.0–36.0)
MCV: 78.7 fL (ref 78.0–100.0)
Platelets: 291 10*3/uL (ref 150–400)
RBC: 5.36 MIL/uL (ref 4.22–5.81)
RDW: 13.4 % (ref 11.5–15.5)
WBC: 10.3 10*3/uL (ref 4.0–10.5)

## 2017-12-23 LAB — ETHANOL: ALCOHOL ETHYL (B): 47 mg/dL — AB (ref <10)

## 2017-12-23 SURGERY — REPAIR, LACERATION, FACE
Anesthesia: General | Site: Face

## 2017-12-23 MED ORDER — BACITRACIN ZINC 500 UNIT/GM EX OINT
TOPICAL_OINTMENT | CUTANEOUS | Status: AC
  Filled 2017-12-23: qty 28.35

## 2017-12-23 MED ORDER — SODIUM CHLORIDE 0.9 % IV SOLN
INTRAVENOUS | Status: DC | PRN
  Administered 2017-12-23: 500 mL via INTRAVENOUS

## 2017-12-23 MED ORDER — LACTATED RINGERS IV SOLN
INTRAVENOUS | Status: DC | PRN
  Administered 2017-12-23 – 2017-12-24 (×2): via INTRAVENOUS

## 2017-12-23 MED ORDER — FENTANYL CITRATE (PF) 250 MCG/5ML IJ SOLN
INTRAMUSCULAR | Status: AC
  Filled 2017-12-23: qty 5

## 2017-12-23 MED ORDER — MIDAZOLAM HCL 2 MG/2ML IJ SOLN
INTRAMUSCULAR | Status: AC
  Filled 2017-12-23: qty 2

## 2017-12-23 MED ORDER — CEFAZOLIN SODIUM-DEXTROSE 1-4 GM/50ML-% IV SOLN
1.0000 g | Freq: Once | INTRAVENOUS | Status: AC
  Administered 2017-12-23: 1 g via INTRAVENOUS
  Filled 2017-12-23: qty 50

## 2017-12-23 MED ORDER — HYDROMORPHONE HCL 1 MG/ML IJ SOLN
0.5000 mg | Freq: Once | INTRAMUSCULAR | Status: AC
  Administered 2017-12-23: 0.5 mg via INTRAVENOUS
  Filled 2017-12-23: qty 1

## 2017-12-23 MED ORDER — FENTANYL CITRATE (PF) 100 MCG/2ML IJ SOLN
INTRAMUSCULAR | Status: DC | PRN
  Administered 2017-12-23: 50 ug via INTRAVENOUS
  Administered 2017-12-23: 100 ug via INTRAVENOUS
  Administered 2017-12-23 – 2017-12-24 (×2): 50 ug via INTRAVENOUS

## 2017-12-23 MED ORDER — SODIUM CHLORIDE 0.9 % IV BOLUS
1000.0000 mL | Freq: Once | INTRAVENOUS | Status: AC
  Administered 2017-12-23: 1000 mL via INTRAVENOUS

## 2017-12-23 MED ORDER — 0.9 % SODIUM CHLORIDE (POUR BTL) OPTIME
TOPICAL | Status: DC | PRN
  Administered 2017-12-23: 1000 mL

## 2017-12-23 MED ORDER — TETANUS-DIPHTH-ACELL PERTUSSIS 5-2.5-18.5 LF-MCG/0.5 IM SUSP
0.5000 mL | Freq: Once | INTRAMUSCULAR | Status: AC
  Administered 2017-12-23: 0.5 mL via INTRAMUSCULAR
  Filled 2017-12-23: qty 0.5

## 2017-12-23 SURGICAL SUPPLY — 48 items
BLADE SURG 15 STRL LF DISP TIS (BLADE) ×1 IMPLANT
BLADE SURG 15 STRL SS (BLADE) ×2
CANISTER SUCT 3000ML PPV (MISCELLANEOUS) ×3 IMPLANT
CLEANER TIP ELECTROSURG 2X2 (MISCELLANEOUS) ×3 IMPLANT
DRAPE HALF SHEET 40X57 (DRAPES) ×3 IMPLANT
DRAPE ORTHO SPLIT 87X125 STRL (DRAPES) ×3 IMPLANT
ELECT COATED BLADE 2.86 ST (ELECTRODE) ×3 IMPLANT
ELECT NEEDLE TIP 2.8 STRL (NEEDLE) IMPLANT
ELECT REM PT RETURN 9FT ADLT (ELECTROSURGICAL) ×3
ELECTRODE REM PT RTRN 9FT ADLT (ELECTROSURGICAL) ×1 IMPLANT
GLOVE BIOGEL M 7.0 STRL (GLOVE) ×3 IMPLANT
GLOVE BIOGEL PI IND STRL 7.0 (GLOVE) ×1 IMPLANT
GLOVE BIOGEL PI INDICATOR 7.0 (GLOVE) ×2
GLOVE SURG SS PI 7.0 STRL IVOR (GLOVE) ×3 IMPLANT
GOWN STRL REUS W/ TWL LRG LVL3 (GOWN DISPOSABLE) ×2 IMPLANT
GOWN STRL REUS W/TWL LRG LVL3 (GOWN DISPOSABLE) ×4
KIT BASIN OR (CUSTOM PROCEDURE TRAY) ×3 IMPLANT
KIT TURNOVER KIT B (KITS) ×3 IMPLANT
NEEDLE HYPO 25GX1X1/2 BEV (NEEDLE) IMPLANT
NS IRRIG 1000ML POUR BTL (IV SOLUTION) ×3 IMPLANT
PAD ARMBOARD 7.5X6 YLW CONV (MISCELLANEOUS) ×6 IMPLANT
PATTIES SURGICAL .5 X3 (DISPOSABLE) IMPLANT
PENCIL BUTTON HOLSTER BLD 10FT (ELECTRODE) ×3 IMPLANT
SCISSORS WIRE DISP (INSTRUMENTS) ×3 IMPLANT
STAPLER VISISTAT 35W (STAPLE) ×3 IMPLANT
SUT BONE WAX W31G (SUTURE) IMPLANT
SUT CHROMIC 3 0 SH 27 (SUTURE) ×9 IMPLANT
SUT CHROMIC 4 0 PS 2 18 (SUTURE) ×3 IMPLANT
SUT CHROMIC 4 0 RB 1X27 (SUTURE) ×3 IMPLANT
SUT ETHILON 3 0 PS 1 (SUTURE) ×3 IMPLANT
SUT ETHILON 5 0 CL P 3 (SUTURE) ×3 IMPLANT
SUT ETHILON 5 0 P 3 18 (SUTURE) ×4
SUT NYLON ETHILON 5-0 P-3 1X18 (SUTURE) ×2 IMPLANT
SUT SILK 3 0 (SUTURE)
SUT SILK 3 0 SH 30 (SUTURE) ×3 IMPLANT
SUT SILK 3-0 18XBRD TIE 12 (SUTURE) IMPLANT
SUT STEEL 0 (SUTURE)
SUT STEEL 0 18XMFL TIE 17 (SUTURE) IMPLANT
SUT STEEL 2 (SUTURE) ×3 IMPLANT
SUT VIC AB 3-0 FS2 27 (SUTURE) IMPLANT
SUT VIC AB 4-0 P-3 18X BRD (SUTURE) IMPLANT
SUT VIC AB 4-0 P3 18 (SUTURE)
SUT VIC AB 4-0 PS2 27 (SUTURE) ×3 IMPLANT
SUT VIC AB 5-0 P-3 18XBRD (SUTURE) ×1 IMPLANT
SUT VIC AB 5-0 P3 18 (SUTURE) ×2
TOWEL OR 17X24 6PK STRL BLUE (TOWEL DISPOSABLE) ×3 IMPLANT
TRAY ENT MC OR (CUSTOM PROCEDURE TRAY) ×3 IMPLANT
WATER STERILE IRR 1000ML POUR (IV SOLUTION) ×3 IMPLANT
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
## 2017-12-23 NOTE — Anesthesia Preprocedure Evaluation (Addendum)
Anesthesia Evaluation  Patient identified by MRN, date of birth, ID band Patient awake    Reviewed: Allergy & Precautions, NPO status , Patient's Chart, lab work & pertinent test results  Airway Mallampati: II  TM Distance: >3 FB Neck ROM: Full  Mouth opening: Limited Mouth Opening  Dental  (+) Dental Advisory Given   Pulmonary Current Smoker,    Pulmonary exam normal        Cardiovascular Normal cardiovascular exam     Neuro/Psych    GI/Hepatic   Endo/Other    Renal/GU      Musculoskeletal   Abdominal   Peds  Hematology   Anesthesia Other Findings + Cocaine use past week  Reproductive/Obstetrics                            Anesthesia Physical Anesthesia Plan  ASA: III and emergent  Anesthesia Plan: General   Post-op Pain Management:    Induction: Intravenous, Rapid sequence and Cricoid pressure planned  PONV Risk Score and Plan:   Airway Management Planned: Oral ETT  Additional Equipment:   Intra-op Plan:   Post-operative Plan: Extubation in OR  Informed Consent: I have reviewed the patients History and Physical, chart, labs and discussed the procedure including the risks, benefits and alternatives for the proposed anesthesia with the patient or authorized representative who has indicated his/her understanding and acceptance.     Plan Discussed with: CRNA and Surgeon  Anesthesia Plan Comments:         Anesthesia Quick Evaluation
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
## 2017-12-23 NOTE — ED Notes (Signed)
Patient transported to CT 

## 2017-12-24 ENCOUNTER — Other Ambulatory Visit: Payer: Self-pay

## 2017-12-24 ENCOUNTER — Encounter (HOSPITAL_COMMUNITY): Payer: Self-pay | Admitting: Otolaryngology

## 2017-12-24 MED ORDER — BACITRACIN ZINC 500 UNIT/GM EX OINT
TOPICAL_OINTMENT | CUTANEOUS | Status: DC | PRN
  Administered 2017-12-24: 1 via TOPICAL

## 2017-12-24 MED ORDER — ONDANSETRON HCL 4 MG/2ML IJ SOLN: 4.0000 mg | INTRAMUSCULAR | Status: DC | PRN

## 2017-12-24 MED ORDER — MORPHINE SULFATE (PF) 2 MG/ML IV SOLN: 2.0000 mg | INTRAVENOUS | Status: DC | PRN

## 2017-12-24 MED ORDER — PROPOFOL 10 MG/ML IV BOLUS
INTRAVENOUS | Status: DC | PRN
  Administered 2017-12-23: 150 mg via INTRAVENOUS

## 2017-12-24 MED ORDER — ONDANSETRON HCL 4 MG PO TABS: 4.0000 mg | ORAL_TABLET | ORAL | Status: DC | PRN

## 2017-12-24 MED ORDER — AMOXICILLIN-POT CLAVULANATE 500-125 MG PO TABS: 1.0000 | ORAL_TABLET | Freq: Two times a day (BID) | ORAL | 0 refills | Status: DC

## 2017-12-24 MED ORDER — DEXTROSE IN LACTATED RINGERS 5 % IV SOLN
INTRAVENOUS | Status: DC
  Administered 2017-12-24: 03:00:00 via INTRAVENOUS

## 2017-12-24 MED ORDER — SUCCINYLCHOLINE CHLORIDE 20 MG/ML IJ SOLN
INTRAMUSCULAR | Status: DC | PRN
  Administered 2017-12-23: 125 mg via INTRAVENOUS

## 2017-12-24 MED ORDER — LIDOCAINE 2% (20 MG/ML) 5 ML SYRINGE
INTRAMUSCULAR | Status: DC | PRN
  Administered 2017-12-23: 100 mg via INTRAVENOUS

## 2017-12-24 MED ORDER — SODIUM CHLORIDE 0.9 % IV SOLN
INTRAVENOUS | Status: DC | PRN
  Administered 2017-12-24: 50 ug/min via INTRAVENOUS

## 2017-12-24 MED ORDER — HYDROCODONE-ACETAMINOPHEN 5-325 MG PO TABS: 1.0000 | ORAL_TABLET | Freq: Four times a day (QID) | ORAL | 0 refills | Status: DC | PRN

## 2017-12-24 MED ORDER — CEFAZOLIN SODIUM-DEXTROSE 1-4 GM/50ML-% IV SOLN
1.0000 g | Freq: Three times a day (TID) | INTRAVENOUS | Status: DC
  Administered 2017-12-24: 1 g via INTRAVENOUS
  Filled 2017-12-24 (×5): qty 50

## 2017-12-24 MED ORDER — HYDROCODONE-ACETAMINOPHEN 5-325 MG PO TABS
1.0000 | ORAL_TABLET | ORAL | Status: DC | PRN
  Administered 2017-12-24 – 2017-12-25 (×2): 1 via ORAL
  Filled 2017-12-24 (×3): qty 1

## 2017-12-24 MED ORDER — IBUPROFEN 100 MG/5ML PO SUSP
400.0000 mg | Freq: Four times a day (QID) | ORAL | Status: DC | PRN
  Administered 2017-12-24 – 2017-12-25 (×3): 400 mg via ORAL
  Filled 2017-12-24 (×4): qty 20

## 2017-12-24 MED ORDER — CEFAZOLIN SODIUM-DEXTROSE 1-4 GM/50ML-% IV SOLN
INTRAVENOUS | Status: DC | PRN
  Administered 2017-12-23: 1 g via INTRAVENOUS

## 2017-12-24 MED ORDER — ONDANSETRON HCL 4 MG/2ML IJ SOLN
INTRAMUSCULAR | Status: DC | PRN
  Administered 2017-12-24: 4 mg via INTRAVENOUS

## 2017-12-24 MED ORDER — PHENYLEPHRINE HCL 10 MG/ML IJ SOLN
INTRAMUSCULAR | Status: DC | PRN
  Administered 2017-12-24 (×2): 40 ug via INTRAVENOUS
  Administered 2017-12-24: 80 ug via INTRAVENOUS

## 2017-12-24 MED ORDER — BACITRACIN ZINC 500 UNIT/GM EX OINT
1.0000 "application " | TOPICAL_OINTMENT | Freq: Three times a day (TID) | CUTANEOUS | Status: DC
  Administered 2017-12-24 – 2017-12-25 (×3): 1 via TOPICAL
  Filled 2017-12-24: qty 28.4
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
## 2017-12-24 NOTE — Transfer of Care (Signed)
Immediate Anesthesia Transfer of Care Note  Patient: Gavin Cook  Procedure(s) Performed: RECONSTRUCTION AND CLOSURE FACIAL LACERATION REPAIR (N/A Face)  Patient Location: PACU  Anesthesia Type:General  Level of Consciousness: awake and alert   Airway & Oxygen Therapy: Patient Spontanous Breathing and Patient connected to face mask oxygen  Post-op Assessment: Report given to RN and Post -op Vital signs reviewed and stable  Post vital signs: Reviewed and stable  Last Vitals:  Vitals Value Taken Time  BP 162/101 12/24/2017  1:40 AM  Temp    Pulse 96 12/24/2017  1:40 AM  Resp 21 12/24/2017  1:40 AM  SpO2 95 % 12/24/2017  1:40 AM  Vitals shown include unvalidated device data.  Last Pain:  Vitals:   12/23/17 1932  TempSrc:   PainSc: 0-No pain         Complications: No apparent anesthesia complications
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
## 2017-12-24 NOTE — Anesthesia Procedure Notes (Signed)
Procedure Name: Intubation Date/Time: 12/23/2017 11:58 PM Performed by: Edmonia CaprioAuston, Wayland Baik M, CRNA Pre-anesthesia Checklist: Emergency Drugs available, Suction available, Patient being monitored, Timeout performed and Patient identified Patient Re-evaluated:Patient Re-evaluated prior to induction Oxygen Delivery Method: Circle system utilized Preoxygenation: Pre-oxygenation with 100% oxygen Induction Type: IV induction and Rapid sequence Laryngoscope Size: Miller and 2 Grade View: Grade I Tube type: Oral Tube size: 7.5 mm Number of attempts: 1 Airway Equipment and Method: Stylet Placement Confirmation: positive ETCO2,  ETT inserted through vocal cords under direct vision and breath sounds checked- equal and bilateral Secured at: 23 cm Tube secured with: Tape Dental Injury: Teeth and Oropharynx as per pre-operative assessment
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
## 2017-12-24 NOTE — Progress Notes (Signed)
Received report from Rasheen,RN that pt. is discharged and waiting on ride; spoke with pt. And he stated he had a ride coming; asked him to call his person again; unsure if pt. called his brother; stated that he had to work late and was to come after 1700.

## 2017-12-24 NOTE — Discharge Instructions (Signed)
Wound Care Instructions: 1. Limited activity 2. Liquid and soft diet, advance as tolerated 3. May bathe and shower, keep incision dry for 3 days postop 4. Wound care - 1/2 str H2O2 and bacitracin ointment twice daily 5. Elevate Head of Bed 6. Ice compress for 24 hrs 7. Warm salt water mouth rinse twice daily     Facial Laceration A facial laceration is a cut on the face. These injuries can be painful and cause bleeding. Some cuts may need to be closed with stitches (sutures), skin adhesive strips, or wound glue. Cuts usually heal quickly but can leave a scar. It can take 1-2 years for the scar to go away completely. Follow these instructions at home:  Only take medicines as told by your doctor.  Follow your doctor's instructions for wound care. For Stitches:  Keep the cut clean and dry.  If you have a bandage (dressing), change it at least once a day. Change the bandage if it gets wet or dirty, or as told by your doctor.  Wash the cut with soap and water 2 times a day. Rinse the cut with water. Pat it dry with a clean towel.  Put a thin layer of medicated cream on the cut as told by your doctor.  You may shower after the first 24 hours. Do not soak the cut in water until the stitches are removed.  Have your stitches removed as told by your doctor.  Do not wear any makeup until a few days after your stitches are removed. For Skin Adhesive Strips:  Keep the cut clean and dry.  Do not get the strips wet. You may take a bath, but be careful to keep the cut dry.  If the cut gets wet, pat it dry with a clean towel.  The strips will fall off on their own. Do not remove the strips that are still stuck to the cut. For Wound Glue:  You may shower or take baths. Do not soak or scrub the cut. Do not swim. Avoid heavy sweating until the glue falls off on its own. After a shower or bath, pat the cut dry with a clean towel.  Do not put medicine or makeup on your cut until the glue  falls off.  If you have a bandage, do not put tape over the glue.  Avoid lots of sunlight or tanning lamps until the glue falls off.  The glue will fall off on its own in 5-10 days. Do not pick at the glue. After Healing:  Put sunscreen on the cut for the first year to reduce your scar. Contact a doctor if:  You have a fever. Get help right away if:  Your cut area gets red, painful, or puffy (swollen).  You see a yellowish-white fluid (pus) coming from the cut. This information is not intended to replace advice given to you by your health care provider. Make sure you discuss any questions you have with your health care provider. Document Released: 10/04/2007 Document Revised: 09/23/2015 Document Reviewed: 11/28/2012 Elsevier Interactive Patient Education  2017 [employer]

## 2017-12-24 NOTE — Care Management Note (Addendum)
Case Management Note  Patient Details  Name: Gavin Cook MRN: [id number] Date of Birth: 12/03/1970  Subjective/Objective:    Facial Laceration/ Reconstruction               Action/Plan: CM talked to patient at the bedside, no PCP, no medical insurance; pt [facility]; pt listed as self pay; Financial Counselor to see pt to determine what he might qualify for. [facility], he stated that he will get his prescriptions filled at dc. Abelino DerrickB Sharry Beining RN,MHA,BSN  11:24 am- [facility] at this time; [facility] called, apt made for Sept 26,2019 at 2:30 pm. Alexis GoodellB Ivonne Freeburg RN,MHA,BSN  Expected Discharge Date:  12/24/17               Expected Discharge Plan:   Home  In-House Referral:   Financial Counselor  Status of Service:   In progress  Reola MosherChandler, Miller Limehouse L, RN,MHA,BSN [phone] 12/24/2017, 11:15 AM

## 2017-12-24 NOTE — Progress Notes (Signed)
Discharge instructions completed with pt.  Pt verbalized understanding of the information.  Pt denies chest pain, shortness of breath, dizziness, lightheadedness, and n/v.  Pt's IV discontinued.  Pt waiting for ride to go home.

## 2017-12-24 NOTE — Anesthesia Postprocedure Evaluation (Signed)
Anesthesia Post Note  Patient: Gavin Cook  Procedure(s) Performed: RECONSTRUCTION AND CLOSURE FACIAL LACERATION REPAIR (N/A Face)     Patient location during evaluation: PACU Anesthesia Type: General Level of consciousness: awake and alert Pain management: pain level controlled Vital Signs Assessment: post-procedure vital signs reviewed and stable Respiratory status: spontaneous breathing, nonlabored ventilation, respiratory function stable and patient connected to nasal cannula oxygen Cardiovascular status: blood pressure returned to baseline and stable Postop Assessment: no apparent nausea or vomiting Anesthetic complications: no    Last Vitals:  Vitals:   12/23/17 2200 12/23/17 2300  BP: (!) 152/101 (!) 146/94  Pulse: 87 90  Resp: 11 (!) 21  Temp:    SpO2: 95% 97%    Last Pain:  Vitals:   12/23/17 1932  TempSrc:   PainSc: 0-No pain                 Gavin Cook DAVID

## 2017-12-25 NOTE — Progress Notes (Signed)
Patient provided with MATCH for assistance to fill abx at DC.

## 2017-12-25 NOTE — Progress Notes (Signed)
Notified by RN that patient is requesting RW. [facility]

## 2017-12-25 NOTE — Progress Notes (Signed)
CSW provided patient with bus pass to utilize once discharged.  Antony Blackbirdynthia Mima Cranmore, [facility] Sisters[facility]WA Clinical Social Worker [phone]

## 2017-12-28 ENCOUNTER — Encounter (HOSPITAL_COMMUNITY): Payer: Self-pay | Admitting: Emergency Medicine

## 2017-12-28 ENCOUNTER — Inpatient Hospital Stay (HOSPITAL_COMMUNITY)
Admission: EM | Admit: 2017-12-28 | Discharge: 2017-12-30 | DRG: 581 | Disposition: A | Discharge status: Home / Self Care | Payer: No Typology Code available for payment source | Attending: Student in an Organized Health Care Education/Training Program | Admitting: Student in an Organized Health Care Education/Training Program

## 2017-12-28 ENCOUNTER — Other Ambulatory Visit: Payer: Self-pay

## 2017-12-28 ENCOUNTER — Emergency Department (HOSPITAL_COMMUNITY): Payer: No Typology Code available for payment source

## 2017-12-28 DIAGNOSIS — Y92410 Unspecified street and highway as the place of occurrence of the external cause: Secondary | ICD-10-CM

## 2017-12-28 DIAGNOSIS — Z720 Tobacco use: Secondary | ICD-10-CM

## 2017-12-28 DIAGNOSIS — L03211 Cellulitis of face: Secondary | ICD-10-CM | POA: Diagnosis not present

## 2017-12-28 DIAGNOSIS — Z91018 Allergy to other foods: Secondary | ICD-10-CM

## 2017-12-28 DIAGNOSIS — F1499 Cocaine use, unspecified with unspecified cocaine-induced disorder: Secondary | ICD-10-CM | POA: Diagnosis present

## 2017-12-28 DIAGNOSIS — G8929 Other chronic pain: Secondary | ICD-10-CM | POA: Diagnosis present

## 2017-12-28 DIAGNOSIS — L089 Local infection of the skin and subcutaneous tissue, unspecified: Secondary | ICD-10-CM

## 2017-12-28 DIAGNOSIS — F17200 Nicotine dependence, unspecified, uncomplicated: Secondary | ICD-10-CM | POA: Diagnosis present

## 2017-12-28 DIAGNOSIS — Z8249 Family history of ischemic heart disease and other diseases of the circulatory system: Secondary | ICD-10-CM

## 2017-12-28 DIAGNOSIS — T148XXA Other injury of unspecified body region, initial encounter: Secondary | ICD-10-CM

## 2017-12-28 DIAGNOSIS — F17210 Nicotine dependence, cigarettes, uncomplicated: Secondary | ICD-10-CM | POA: Diagnosis present

## 2017-12-28 DIAGNOSIS — S0181XA Laceration without foreign body of other part of head, initial encounter: Secondary | ICD-10-CM | POA: Diagnosis present

## 2017-12-28 DIAGNOSIS — Z79899 Other long term (current) drug therapy: Secondary | ICD-10-CM

## 2017-12-28 DIAGNOSIS — M549 Dorsalgia, unspecified: Secondary | ICD-10-CM | POA: Diagnosis present

## 2017-12-28 LAB — COMPREHENSIVE METABOLIC PANEL
ALT: 36 U/L (ref 0–44)
AST: 95 U/L — ABNORMAL HIGH (ref 15–41)
Albumin: 2.9 g/dL — ABNORMAL LOW (ref 3.5–5.0)
Alkaline Phosphatase: 53 U/L (ref 38–126)
Anion gap: 7 (ref 5–15)
BILIRUBIN TOTAL: 1.2 mg/dL (ref 0.3–1.2)
BUN: 12 mg/dL (ref 6–20)
CALCIUM: 8.5 mg/dL — AB (ref 8.9–10.3)
CO2: 27 mmol/L (ref 22–32)
CREATININE: 1.26 mg/dL — AB (ref 0.61–1.24)
Chloride: 105 mmol/L (ref 98–111)
GFR calc Af Amer: >60 mL/min (ref >60)
Glucose, Bld: 100 mg/dL — ABNORMAL HIGH (ref 70–99)
POTASSIUM: 3.8 mmol/L (ref 3.5–5.1)
Sodium: 139 mmol/L (ref 135–145)
TOTAL PROTEIN: 5.8 g/dL — AB (ref 6.5–8.1)

## 2017-12-28 LAB — CBC WITH DIFFERENTIAL/PLATELET
ABS IMMATURE GRANULOCYTES: 0 10*3/uL (ref 0.0–0.1)
Basophils Absolute: 0 10*3/uL (ref 0.0–0.1)
Basophils Relative: 0 %
Eosinophils Absolute: 0.2 10*3/uL (ref 0.0–0.7)
Eosinophils Relative: 2 %
HEMATOCRIT: 37.8 % — AB (ref 39.0–52.0)
HEMOGLOBIN: 11.9 g/dL — AB (ref 13.0–17.0)
IMMATURE GRANULOCYTES: 0 %
LYMPHS PCT: 16 %
Lymphs Abs: 1.4 10*3/uL (ref 0.7–4.0)
MCH: 24.4 pg — ABNORMAL LOW (ref 26.0–34.0)
MCHC: 31.5 g/dL (ref 30.0–36.0)
MCV: 77.6 fL — AB (ref 78.0–100.0)
MONOS PCT: 15 %
Monocytes Absolute: 1.4 10*3/uL — ABNORMAL HIGH (ref 0.1–1.0)
NEUTROS ABS: 6.1 10*3/uL (ref 1.7–7.7)
NEUTROS PCT: 67 %
Platelets: 344 10*3/uL (ref 150–400)
RBC: 4.87 MIL/uL (ref 4.22–5.81)
RDW: 12.8 % (ref 11.5–15.5)
WBC: 9.1 10*3/uL (ref 4.0–10.5)

## 2017-12-28 MED ORDER — IBUPROFEN 400 MG PO TABS: 800.0000 mg | ORAL_TABLET | Freq: Four times a day (QID) | ORAL | Status: DC | PRN

## 2017-12-28 MED ORDER — ENOXAPARIN SODIUM 40 MG/0.4ML ~~LOC~~ SOLN
40.0000 mg | SUBCUTANEOUS | Status: DC
  Administered 2017-12-28 – 2017-12-29 (×2): 40 mg via SUBCUTANEOUS
  Filled 2017-12-28 (×2): qty 0.4

## 2017-12-28 MED ORDER — ACETAMINOPHEN 650 MG RE SUPP: 650.0000 mg | Freq: Four times a day (QID) | RECTAL | Status: DC | PRN

## 2017-12-28 MED ORDER — HYDROMORPHONE HCL 1 MG/ML IJ SOLN
0.5000 mg | Freq: Once | INTRAMUSCULAR | Status: AC
  Administered 2017-12-28: 0.5 mg via INTRAVENOUS
  Filled 2017-12-28: qty 1

## 2017-12-28 MED ORDER — VANCOMYCIN HCL IN DEXTROSE 750-5 MG/150ML-% IV SOLN
750.0000 mg | Freq: Two times a day (BID) | INTRAVENOUS | Status: DC
  Administered 2017-12-28 – 2017-12-30 (×4): 750 mg via INTRAVENOUS
  Filled 2017-12-28 (×5): qty 150

## 2017-12-28 MED ORDER — VANCOMYCIN HCL IN DEXTROSE 1-5 GM/200ML-% IV SOLN
1000.0000 mg | Freq: Once | INTRAVENOUS | Status: AC
  Administered 2017-12-28: 1000 mg via INTRAVENOUS
  Filled 2017-12-28: qty 200

## 2017-12-28 MED ORDER — IBUPROFEN 600 MG PO TABS
600.0000 mg | ORAL_TABLET | Freq: Three times a day (TID) | ORAL | Status: DC
  Administered 2017-12-28 – 2017-12-30 (×5): 600 mg via ORAL
  Filled 2017-12-28 (×5): qty 1

## 2017-12-28 MED ORDER — OXYCODONE HCL 5 MG PO TABS
5.0000 mg | ORAL_TABLET | ORAL | Status: DC | PRN
  Administered 2017-12-28 – 2017-12-29 (×3): 5 mg via ORAL
  Filled 2017-12-28 (×3): qty 1

## 2017-12-28 MED ORDER — IOHEXOL 300 MG/ML  SOLN
75.0000 mL | Freq: Once | INTRAMUSCULAR | Status: AC
  Administered 2017-12-28: 75 mL via INTRAVENOUS

## 2017-12-28 MED ORDER — ACETAMINOPHEN 325 MG PO TABS
650.0000 mg | ORAL_TABLET | Freq: Four times a day (QID) | ORAL | Status: DC | PRN
  Administered 2017-12-29: 650 mg via ORAL
  Filled 2017-12-28: qty 2

## 2017-12-28 MED ORDER — IOPAMIDOL (ISOVUE-300) INJECTION 61%: 100.0000 mL | Freq: Once | INTRAVENOUS | Status: DC
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
## 2017-12-28 NOTE — Progress Notes (Signed)
Patient trasfered from ED to [phone] via stretcher; alert and oriented x 4;  complaints of pain in left foot and face; IV saline locked in LFA. Orient patient to room and unit; watch safety video; gave patient care guide; instructed how to use the call bell and  fall risk precautions. Will continue to monitor the patient.

## 2017-12-28 NOTE — ED Notes (Signed)
Report given to [facility].  
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
## 2017-12-28 NOTE — ED Notes (Signed)
Pt noncompliant with antibiotic as well as pain meds,
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
## 2017-12-29 DIAGNOSIS — F17210 Nicotine dependence, cigarettes, uncomplicated: Secondary | ICD-10-CM | POA: Diagnosis present

## 2017-12-29 DIAGNOSIS — L089 Local infection of the skin and subcutaneous tissue, unspecified: Secondary | ICD-10-CM | POA: Diagnosis present

## 2017-12-29 DIAGNOSIS — F17200 Nicotine dependence, unspecified, uncomplicated: Secondary | ICD-10-CM | POA: Diagnosis present

## 2017-12-29 DIAGNOSIS — Z8249 Family history of ischemic heart disease and other diseases of the circulatory system: Secondary | ICD-10-CM | POA: Diagnosis not present

## 2017-12-29 DIAGNOSIS — Z91018 Allergy to other foods: Secondary | ICD-10-CM | POA: Diagnosis not present

## 2017-12-29 DIAGNOSIS — G8929 Other chronic pain: Secondary | ICD-10-CM | POA: Diagnosis present

## 2017-12-29 DIAGNOSIS — F1499 Cocaine use, unspecified with unspecified cocaine-induced disorder: Secondary | ICD-10-CM | POA: Diagnosis present

## 2017-12-29 DIAGNOSIS — Y92410 Unspecified street and highway as the place of occurrence of the external cause: Secondary | ICD-10-CM | POA: Diagnosis not present

## 2017-12-29 DIAGNOSIS — M549 Dorsalgia, unspecified: Secondary | ICD-10-CM | POA: Diagnosis present

## 2017-12-29 DIAGNOSIS — L03211 Cellulitis of face: Secondary | ICD-10-CM | POA: Diagnosis present

## 2017-12-29 DIAGNOSIS — Z79899 Other long term (current) drug therapy: Secondary | ICD-10-CM | POA: Diagnosis not present

## 2017-12-29 DIAGNOSIS — T8149XD Infection following a procedure, other surgical site, subsequent encounter: Secondary | ICD-10-CM

## 2017-12-29 DIAGNOSIS — S0181XA Laceration without foreign body of other part of head, initial encounter: Secondary | ICD-10-CM | POA: Diagnosis present

## 2017-12-29 LAB — BASIC METABOLIC PANEL
ANION GAP: 7 (ref 5–15)
BUN: 9 mg/dL (ref 6–20)
CALCIUM: 8.7 mg/dL — AB (ref 8.9–10.3)
CO2: 28 mmol/L (ref 22–32)
Chloride: 104 mmol/L (ref 98–111)
Creatinine, Ser: 1.23 mg/dL (ref 0.61–1.24)
Glucose, Bld: 100 mg/dL — ABNORMAL HIGH (ref 70–99)
Potassium: 4 mmol/L (ref 3.5–5.1)
SODIUM: 139 mmol/L (ref 135–145)

## 2017-12-29 LAB — HIV ANTIBODY (ROUTINE TESTING W REFLEX): HIV SCREEN 4TH GENERATION: NONREACTIVE

## 2017-12-29 MED ORDER — OXYCODONE HCL 5 MG PO TABS
5.0000 mg | ORAL_TABLET | Freq: Three times a day (TID) | ORAL | Status: DC | PRN
  Administered 2017-12-29 – 2017-12-30 (×3): 5 mg via ORAL
  Filled 2017-12-29 (×3): qty 1

## 2017-12-29 NOTE — Progress Notes (Signed)
Patient ID: Gavin Cook, male   DOB: 01/26/1971, 47 y.o.   MRN: [id number] Afebrile, no new complaints.   Swelling slightly better, no fluctuance.   No need for surgical intervention. Re-consult as needed.
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
## 2017-12-30 LAB — BASIC METABOLIC PANEL
Anion gap: 5 (ref 5–15)
BUN: 9 mg/dL (ref 6–20)
CO2: 27 mmol/L (ref 22–32)
CREATININE: 1.18 mg/dL (ref 0.61–1.24)
Calcium: 8.4 mg/dL — ABNORMAL LOW (ref 8.9–10.3)
Chloride: 106 mmol/L (ref 98–111)
GFR calc Af Amer: >60 mL/min (ref >60)
GFR calc non Af Amer: >60 mL/min (ref >60)
GLUCOSE: 105 mg/dL — AB (ref 70–99)
POTASSIUM: 3.9 mmol/L (ref 3.5–5.1)
SODIUM: 138 mmol/L (ref 135–145)

## 2017-12-30 MED ORDER — CLINDAMYCIN HCL 300 MG PO CAPS
450.0000 mg | ORAL_CAPSULE | Freq: Three times a day (TID) | ORAL | Status: DC
  Filled 2017-12-30: qty 1

## 2017-12-30 MED ORDER — CLINDAMYCIN HCL 150 MG PO CAPS: 450.0000 mg | ORAL_CAPSULE | Freq: Three times a day (TID) | ORAL | 0 refills | Status: DC
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
## 2017-12-30 NOTE — Care Management (Signed)
Patient provided with good Rx coupon for antibiotic.
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
## 2017-12-30 NOTE — Discharge Instructions (Signed)
Please be sure to gently rinse your mouth with saline solution 4-5 times daily. Please mix 1/2 a teaspoon of table salt with 8 ounces of water and gently swish this slowly until you complete the 8 ounce bottle. Do not spit, swish roughly, drink through a straw until cleared to do so by your doctor. In addition, I would recommend brushing your teeth carefully avoiding direct contact with the brush to your surgical site.   You have been provided a prescription for antibiotics to take for the next 7 days. Please take one tablet every 8 hours until they are gone. If your infection returns or you develop similar symptoms again, please notify your doctor of visit the ED.  Please take 800 mg ibuprofen every 8 hours for pain.  In addition you may take 1000 mg of acetaminophen no more than 3 times daily to assist with the pain.  Recommending: Applying a cold compress for edema and swelling.

## 2018-01-02 ENCOUNTER — Emergency Department (HOSPITAL_COMMUNITY)
Admission: EM | Admit: 2018-01-02 | Discharge: 2018-01-02 | Disposition: A | Discharge status: Home / Self Care | Payer: No Typology Code available for payment source | Attending: Emergency Medicine | Admitting: Emergency Medicine

## 2018-01-02 ENCOUNTER — Encounter (HOSPITAL_COMMUNITY): Payer: Self-pay | Admitting: Emergency Medicine

## 2018-01-02 ENCOUNTER — Emergency Department (HOSPITAL_COMMUNITY): Payer: No Typology Code available for payment source

## 2018-01-02 DIAGNOSIS — Y999 Unspecified external cause status: Secondary | ICD-10-CM | POA: Diagnosis not present

## 2018-01-02 DIAGNOSIS — Y9389 Activity, other specified: Secondary | ICD-10-CM | POA: Insufficient documentation

## 2018-01-02 DIAGNOSIS — R600 Localized edema: Secondary | ICD-10-CM | POA: Diagnosis not present

## 2018-01-02 DIAGNOSIS — Y92410 Unspecified street and highway as the place of occurrence of the external cause: Secondary | ICD-10-CM | POA: Diagnosis not present

## 2018-01-02 DIAGNOSIS — Z5189 Encounter for other specified aftercare: Secondary | ICD-10-CM | POA: Diagnosis not present

## 2018-01-02 DIAGNOSIS — F17210 Nicotine dependence, cigarettes, uncomplicated: Secondary | ICD-10-CM | POA: Insufficient documentation

## 2018-01-02 DIAGNOSIS — S92515A Nondisplaced fracture of proximal phalanx of left lesser toe(s), initial encounter for closed fracture: Secondary | ICD-10-CM | POA: Diagnosis not present

## 2018-01-02 DIAGNOSIS — S99922A Unspecified injury of left foot, initial encounter: Secondary | ICD-10-CM | POA: Diagnosis present

## 2018-01-02 MED ORDER — HYDROCODONE-ACETAMINOPHEN 5-325 MG PO TABS
1.0000 | ORAL_TABLET | Freq: Once | ORAL | Status: AC
  Administered 2018-01-02: 1 via ORAL
  Filled 2018-01-02: qty 1

## 2018-01-02 MED ORDER — TRAMADOL HCL 50 MG PO TABS: 50.0000 mg | ORAL_TABLET | Freq: Four times a day (QID) | ORAL | 0 refills | Status: DC | PRN
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
## 2018-01-02 NOTE — Discharge Instructions (Addendum)
You were seen today for foot pain.  You do have a small nondisplaced fracture of your toe.  Wear a postop shoe for comfort.  Keep the toe buddy taped for the next 4 to 6 weeks to the adjacent toe.  Regarding your face, you need to follow-up closely with ENT.  Continue antibiotics.  If you develop fevers, worsening swelling, any new or worsening symptoms you should call ENT directly for reevaluation.

## 2018-01-04 ENCOUNTER — Encounter (HOSPITAL_COMMUNITY): Payer: Self-pay

## 2018-01-04 ENCOUNTER — Ambulatory Visit (HOSPITAL_COMMUNITY)
Admission: EM | Admit: 2018-01-04 | Discharge: 2018-01-04 | Disposition: A | Discharge status: Home / Self Care | Payer: Self-pay | Attending: Family Medicine | Admitting: Family Medicine

## 2018-01-04 DIAGNOSIS — T8189XA Other complications of procedures, not elsewhere classified, initial encounter: Secondary | ICD-10-CM

## 2018-01-04 DIAGNOSIS — Z4802 Encounter for removal of sutures: Secondary | ICD-10-CM

## 2018-01-04 DIAGNOSIS — S01511A Laceration without foreign body of lip, initial encounter: Secondary | ICD-10-CM
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
## 2018-01-04 NOTE — Discharge Instructions (Signed)
Follow up with your surgeon

## 2018-01-10 ENCOUNTER — Emergency Department (HOSPITAL_COMMUNITY)
Admission: EM | Admit: 2018-01-10 | Discharge: 2018-01-11 | Disposition: A | Discharge status: Home / Self Care | Payer: Self-pay | Attending: Emergency Medicine | Admitting: Emergency Medicine

## 2018-01-10 ENCOUNTER — Other Ambulatory Visit: Payer: Self-pay

## 2018-01-10 ENCOUNTER — Encounter (HOSPITAL_COMMUNITY): Payer: Self-pay | Admitting: Emergency Medicine

## 2018-01-10 DIAGNOSIS — M79672 Pain in left foot: Secondary | ICD-10-CM | POA: Insufficient documentation

## 2018-01-10 DIAGNOSIS — S92515D Nondisplaced fracture of proximal phalanx of left lesser toe(s), subsequent encounter for fracture with routine healing: Secondary | ICD-10-CM | POA: Insufficient documentation

## 2018-01-10 DIAGNOSIS — F17210 Nicotine dependence, cigarettes, uncomplicated: Secondary | ICD-10-CM | POA: Insufficient documentation

## 2018-01-10 DIAGNOSIS — S92515A Nondisplaced fracture of proximal phalanx of left lesser toe(s), initial encounter for closed fracture: Secondary | ICD-10-CM

## 2018-01-10 DIAGNOSIS — R0981 Nasal congestion: Secondary | ICD-10-CM | POA: Insufficient documentation

## 2018-01-10 DIAGNOSIS — G8918 Other acute postprocedural pain: Secondary | ICD-10-CM | POA: Insufficient documentation

## 2018-01-10 DIAGNOSIS — F1410 Cocaine abuse, uncomplicated: Secondary | ICD-10-CM | POA: Insufficient documentation

## 2018-01-10 NOTE — ED Notes (Signed)
Pt is given a smaller post op shoe. One that fits.
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
## 2018-01-11 MED ORDER — IBUPROFEN 800 MG PO TABS: 800.0000 mg | ORAL_TABLET | Freq: Three times a day (TID) | ORAL | 0 refills | Status: DC

## 2018-01-11 MED ORDER — CLINDAMYCIN HCL 150 MG PO CAPS: 300.0000 mg | ORAL_CAPSULE | Freq: Four times a day (QID) | ORAL | 0 refills | Status: DC
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
## 2018-01-11 NOTE — ED Notes (Signed)
Patient verbalizes understanding of discharge instructions. Opportunity for questioning and answers were provided. Armband removed by staff, pt discharged from ED to home via POV  

## 2018-01-11 NOTE — ED Notes (Signed)
Pt reports increased pain and swelling to his upper right lip area from stitches he had placed. Pt also complains of increased pain to his left foot that he was seen for earlier today.

## 2018-01-24 ENCOUNTER — Inpatient Hospital Stay (INDEPENDENT_AMBULATORY_CARE_PROVIDER_SITE_OTHER): Payer: Self-pay | Admitting: Physician Assistant

## 2018-02-19 ENCOUNTER — Emergency Department (HOSPITAL_COMMUNITY)
Admission: EM | Admit: 2018-02-19 | Discharge: 2018-02-19 | Disposition: A | Discharge status: Home / Self Care | Payer: Self-pay | Attending: Emergency Medicine | Admitting: Emergency Medicine

## 2018-02-19 ENCOUNTER — Encounter (HOSPITAL_COMMUNITY): Payer: Self-pay | Admitting: Emergency Medicine

## 2018-02-19 ENCOUNTER — Other Ambulatory Visit: Payer: Self-pay

## 2018-02-19 DIAGNOSIS — F17210 Nicotine dependence, cigarettes, uncomplicated: Secondary | ICD-10-CM | POA: Insufficient documentation

## 2018-02-19 DIAGNOSIS — Z79899 Other long term (current) drug therapy: Secondary | ICD-10-CM | POA: Insufficient documentation

## 2018-02-19 DIAGNOSIS — L03211 Cellulitis of face: Secondary | ICD-10-CM | POA: Insufficient documentation

## 2018-02-19 MED ORDER — LIDOCAINE HCL (PF) 1 % IJ SOLN
5.0000 mL | Freq: Once | INTRAMUSCULAR | Status: AC
  Administered 2018-02-19: 5 mL
  Filled 2018-02-19: qty 5

## 2018-02-19 MED ORDER — CLINDAMYCIN HCL 150 MG PO CAPS: 450.0000 mg | ORAL_CAPSULE | Freq: Three times a day (TID) | ORAL | 0 refills | Status: AC

## 2018-02-19 MED ORDER — CHLORHEXIDINE GLUCONATE 0.12 % MT SOLN: 15.0000 mL | Freq: Two times a day (BID) | OROMUCOSAL | 0 refills | Status: DC

## 2018-02-19 MED ORDER — CLINDAMYCIN HCL 150 MG PO CAPS
450.0000 mg | ORAL_CAPSULE | Freq: Once | ORAL | Status: AC
  Administered 2018-02-19: 450 mg via ORAL
  Filled 2018-02-19: qty 3

## 2018-02-19 NOTE — Discharge Instructions (Signed)
Please return to the Emergency Department for any new or worsening symptoms or if your symptoms do not improve. Please be sure to follow up with your Primary Care Physician as soon as possible regarding your visit today. If you do not have a Primary Doctor please use the resources below to establish one. Please follow-up with the ear nose and throat doctors as soon as possible. Please use the antibiotic medication clindamycin as prescribed. Please use the antiseptic mouthwash as prescribed.  Do not swallow the Peridex mouthwash.  Contact a health care provider if: You have a fever. Your symptoms do not improve within 1-2 days of starting treatment. Your bone or joint underneath the infected area becomes painful after the skin has healed. Your infection returns in the same area or another area. You notice a swollen bump in the infected area. You develop new symptoms. You have a general ill feeling (malaise) with muscle aches and pains. Get help right away if: Your symptoms get worse. You feel very sleepy. You develop vomiting or diarrhea that persists. You notice red streaks coming from the infected area. Your red area gets larger or turns dark in color.  Do not take your medicine if  develop an itchy rash, swelling in your mouth or lips, or difficulty breathing.   RESOURCE GUIDE  Chronic Pain Problems: Contact [facility]  [phone] Patients need to be referred by their primary care doctor.  Insufficient Money for Medicine: Contact United Way:  call "211" or [facility] 778-419-4202.  No Primary Care Doctor: Call [facility]  [phone] - can help you locate a primary care doctor that  accepts your insurance, provides certain services, etc. Physician Referral Service- [phone]  Agencies that provide inexpensive medical care: [facility]  [phone] [facility]  [phone] [facility]   [phone] [facility]  [phone] [facility]  [phone] [facility]  [phone]  Medicaid-accepting [facility] Providers: [facility]- [address], [address]  [phone], Mon-Fri 9am-7pm, Sat 9am-1pm [facility]- [address] [place], Suite [place]  [phone] [facility]- [address] [place]  [phone] [facility]- [address]  [phone] Renaye Rakers- [address] [place], [address], [phone]  Only accepts [place] patients after they have their name  applied to their card  Self Pay (no insurance) in [facility]: Sickle Cell Patients: Dr Willey Blade, [facility]  [address] [place], [phone] [facility]- [address] [place]  [phone]       [facility] [place]- [address]       -     [facility]- see information above (Speak to [employer] if you do not have insurance)       -  [facility]- [address] [place], [phone]       -  [facility]- [address],  [phone]       -  [facility]- [address], [phone]       -  Dr Julio Sicks-  [address], [address], [place], [phone]       -  [facility]- [address], [phone]       -  [facility]- [address], [phone], also  [address] [phone]       -    [facility]- [address] [place], [phone], 1st & 3rd Saturday   every month, 10am-1pm  1) Find a Doctor and Pay Out of Pocket Although you won't have to find out who is covered by your insurance plan, it is a good idea to ask around and get recommendations. You will then need to call the office and see if the doctor you have chosen will accept you as a new patient and what types of options they offer for patients who are self-pay. Some doctors offer discounts or will set up payment plans for their  patients who do not have insurance, but you will need to ask so you aren't surprised when you get to your appointment.  2) Contact Your Local Health Department Not all health departments have doctors that can see patients for sick visits, but many do, so it is worth a call to see if yours does. If you don't know where your local health department is, you can check in your phone book. The CDC also has a tool to help you locate your state's health department, and many state websites also have listings of all of their local health departments.  3) Find a Walk-in Clinic If your illness is not likely to be very severe or complicated, you may want to try a walk in clinic. These are popping up all over the country in pharmacies, drugstores, and shopping centers. They're usually staffed by nurse practitioners or physician assistants that have been trained to treat common illnesses and complaints. They're usually fairly quick and inexpensive. However, if you have serious medical issues or chronic medical problems, these are probably not your best option  STD Testing [facility] Department of [facility] [place], STD Clinic, [address] [place], phone [phone] or [phone].  Monday - Friday, call for an appointment. [facility] Department of [employer], STD Clinic, [address], [place], phone [phone] or [phone].  Monday - Friday, call for an appointment.  Abuse/Neglect: [facility] Child Abuse Hotline 508-846-6084 [facility] Child Abuse Hotline 647-429-0430 (After Hours)  Emergency Shelter:  [facility] 780 046 2586  Maternity Homes: Room at the [place] [facility] [phone] [facility] [phone]  MRSA Hotline #:   865 036 8046  [facility] Resources  [facility] [place]  [employer] [facility] Dept. [address]                 [address]         [address]  [place] [address]  Blondell Reveal Phone:  [phone]                                  Phone:  [phone]                   Phone:  [phone]  [facility] [place], [phone] [facility] [place]- [phone]       -     [facility] in [place], [address]                                  [phone], [facility] Child Abuse Hotline 785-849-4752 or 5073332376 (After Hours)   Behavioral Health Services  Substance Abuse Resources: [facility] Drug Services  479-442-2011 [facility] [phone] [facility] [place] [phone] Floydene Flock [phone] Residential & Outpatient Substance Abuse Program  [phone]  Psychological Services: [facility]  [phone] [facility] Services  [phone] [facility], [phone] [place]. [address], [place], ACCESS LINE: [phone] or [phone], [employer]za  Dental Assistance  If unable to pay or uninsured, contact:  [facility] or [facility]. to become qualified for the adult dental clinic.  Patients with Medicaid: [facility] [phone] [address], [phone] [address] [phone]  If unable to pay, or uninsured, contact [facility] [phone]) or [facility] Department [phone] in [place], [phone] in [facility]) to become qualified for the adult dental clinic   Other Low-Cost Community Dental Services: [facility]- [address] [place], [place], [place], [address], [phone], [address]. [address], 2nd and 4th Thursday of the month at 6:30am.  10 clients each day by appointment, can sometimes see walk-in patients if someone does not show for an appointment. [facility]- [address] [place], [place], [address],  [phone] [facility] [address], [place], [place], [address], [phone] [facility] Health Department- [phone] [facility] Health Department- [phone] [facility] [phone]
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
## 2018-02-19 NOTE — ED Notes (Signed)
EDP bedside completing I&D
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
## 2018-03-01 ENCOUNTER — Emergency Department (HOSPITAL_COMMUNITY): Payer: Self-pay

## 2018-03-01 ENCOUNTER — Emergency Department (HOSPITAL_COMMUNITY)
Admission: EM | Admit: 2018-03-01 | Discharge: 2018-03-01 | Disposition: A | Discharge status: Home / Self Care | Payer: Self-pay | Attending: Emergency Medicine | Admitting: Emergency Medicine

## 2018-03-01 ENCOUNTER — Encounter (HOSPITAL_COMMUNITY): Payer: Self-pay | Admitting: Emergency Medicine

## 2018-03-01 DIAGNOSIS — F17210 Nicotine dependence, cigarettes, uncomplicated: Secondary | ICD-10-CM | POA: Insufficient documentation

## 2018-03-01 DIAGNOSIS — R55 Syncope and collapse: Secondary | ICD-10-CM | POA: Insufficient documentation

## 2018-03-01 LAB — CBC
HCT: 45.1 % (ref 39.0–52.0)
Hemoglobin: 13.3 g/dL (ref 13.0–17.0)
MCH: 23.3 pg — ABNORMAL LOW (ref 26.0–34.0)
MCHC: 29.5 g/dL — ABNORMAL LOW (ref 30.0–36.0)
MCV: 79 fL — ABNORMAL LOW (ref 80.0–100.0)
Platelets: 363 10*3/uL (ref 150–400)
RBC: 5.71 MIL/uL (ref 4.22–5.81)
RDW: 13.5 % (ref 11.5–15.5)
WBC: 9.5 10*3/uL (ref 4.0–10.5)
nRBC: 0 % (ref 0.0–0.2)

## 2018-03-01 LAB — I-STAT TROPONIN, ED: TROPONIN I, POC: 0.01 ng/mL (ref 0.00–0.08)

## 2018-03-01 LAB — BASIC METABOLIC PANEL
Anion gap: 9 (ref 5–15)
BUN: 8 mg/dL (ref 6–20)
CHLORIDE: 106 mmol/L (ref 98–111)
CO2: 26 mmol/L (ref 22–32)
CREATININE: 1.12 mg/dL (ref 0.61–1.24)
Calcium: 8.8 mg/dL — ABNORMAL LOW (ref 8.9–10.3)
GFR calc Af Amer: >60 mL/min (ref >60)
GFR calc non Af Amer: >60 mL/min (ref >60)
GLUCOSE: 78 mg/dL (ref 70–99)
Potassium: 3.5 mmol/L (ref 3.5–5.1)
Sodium: 141 mmol/L (ref 135–145)
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
## 2018-03-01 NOTE — ED Notes (Signed)
Patient left at this time with all belongings. 

## 2018-03-01 NOTE — ED Triage Notes (Signed)
Pt reports left sided chest pain today.  Pt also reports a headache, then states he was found passed out when he went outside.  Pt is a very poor historian. Alert and oriented in triage.

## 2018-03-01 NOTE — Discharge Instructions (Addendum)
Follow-up with your primary doctor if symptoms persist, and return to the ER if symptoms significantly worsen or change. 

## 2018-06-03 ENCOUNTER — Emergency Department (HOSPITAL_COMMUNITY)
Admission: EM | Admit: 2018-06-03 | Discharge: 2018-06-04 | Disposition: A | Discharge status: Home / Self Care | Payer: Self-pay | Attending: Emergency Medicine | Admitting: Emergency Medicine

## 2018-06-03 ENCOUNTER — Encounter (HOSPITAL_COMMUNITY): Payer: Self-pay | Admitting: *Deleted

## 2018-06-03 ENCOUNTER — Other Ambulatory Visit: Payer: Self-pay

## 2018-06-03 DIAGNOSIS — Z5321 Procedure and treatment not carried out due to patient leaving prior to being seen by health care provider: Secondary | ICD-10-CM | POA: Insufficient documentation

## 2018-06-03 DIAGNOSIS — M549 Dorsalgia, unspecified: Secondary | ICD-10-CM | POA: Insufficient documentation

## 2018-06-03 DIAGNOSIS — R1110 Vomiting, unspecified: Secondary | ICD-10-CM | POA: Insufficient documentation

## 2018-06-03 NOTE — ED Triage Notes (Addendum)
Pt reports having productive cough with bodyaches and chills since last night. Now has n/v. No acute distress is noted at this time.

## 2018-06-04 NOTE — ED Notes (Signed)
Patient was called 2 times and, there was no Answer.

## 2018-06-04 NOTE — ED Notes (Signed)
Pt called for multiple times, discharged from system [facility]BS
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
## 2020-03-17 ENCOUNTER — Other Ambulatory Visit: Payer: Self-pay | Admitting: *Deleted

## 2020-03-17 ENCOUNTER — Other Ambulatory Visit: Payer: Self-pay

## 2020-03-17 DIAGNOSIS — Z125 Encounter for screening for malignant neoplasm of prostate: Secondary | ICD-10-CM

## 2020-03-17 NOTE — Addendum Note (Signed)
Addended by: Narda Rutherford on: 03/17/2020 03:35 PM   Modules accepted: Orders

## 2020-03-17 NOTE — Progress Notes (Signed)
Patient: Eryx Zane           Date of Birth: Dec 11, 1970           MRN: [id number] Visit Date: 03/17/2020 PCP: Patient, No Pcp Per  Prostate Cancer Screening Date of last physical exam:  (Jan 2021) Date of last rectal exam:  (NA) Have you ever had any of the following?: None Have you ever had or been told you have an allergy to latex products?: No Are you currently taking any natural prostate preparations?: No Are you currently experiencing any urinary symptoms?: No  Prostate Exam Exam not completed.   PSA Only.  Patient's History Patient Active Problem List   Diagnosis Date Noted  . Wound infection 12/28/2017  . Facial infection   . Facial laceration 12/23/2017  . Substance-induced psychotic disorder with delusions (HCC)   . Cocaine use disorder, severe, dependence (HCC) 10/07/2017   Past Medical History:  Diagnosis Date  . Chronic back pain   . Chronic back pain     No family history on file.  Social History   Occupational History  . Not on file  Tobacco Use  . Smoking status: Current Some Day Smoker    Packs/day: 0.25    Years: 25.00    Pack years: 6.25    Types: Cigarettes  . Smokeless tobacco: Never Used  Vaping Use  . Vaping Use: Never used  Substance and Sexual Activity  . Alcohol use: Yes  . Drug use: Yes    Frequency: 1.0 times per week    Types: Cocaine    Comment: +Coc  . Sexual activity: Yes

## 2020-03-19 LAB — PROSTATE-SPECIFIC AG, SERUM (LABCORP): Prostate Specific Ag, Serum: 1.2 ng/mL (ref 0.0–4.0)

## 2020-03-19 NOTE — Progress Notes (Signed)
Elevated for his age. Should be below 1

## 2020-03-22 ENCOUNTER — Telehealth: Payer: Self-pay

## 2020-03-22 NOTE — Telephone Encounter (Signed)
Attempted to contact patient regarding lab results. Left message on voicemail requesting return call.  

## 2020-03-23 ENCOUNTER — Telehealth: Payer: Self-pay

## 2020-03-23 NOTE — Telephone Encounter (Signed)
Attempted to contact patient regarding lab (PSA) results, received message "mailbox full".

## 2020-03-24 ENCOUNTER — Telehealth: Payer: Self-pay

## 2020-03-24 NOTE — Telephone Encounter (Signed)
Patient informed PSA results slightly elevated (1.2), should be below 1, will need to be repeated in 1 year, discuss with PCP at 05/13/2020 office visit. Patient verbalized understanding. Copy of lab results mailed to patient.

## 2020-05-13 ENCOUNTER — Ambulatory Visit: Payer: Self-pay | Admitting: Family Medicine

## 2020-06-14 ENCOUNTER — Other Ambulatory Visit: Payer: Self-pay

## 2020-06-14 ENCOUNTER — Encounter: Payer: Self-pay | Admitting: Nurse Practitioner

## 2020-06-14 ENCOUNTER — Other Ambulatory Visit: Payer: Self-pay | Admitting: Nurse Practitioner

## 2020-06-14 ENCOUNTER — Ambulatory Visit: Payer: Self-pay | Attending: Family Medicine | Admitting: Nurse Practitioner

## 2020-06-14 VITALS — BP 136/91 | HR 90 | Temp 98.7°F | Ht 67.0 in | Wt 147.0 lb

## 2020-06-14 DIAGNOSIS — Z7689 Persons encountering health services in other specified circumstances: Secondary | ICD-10-CM

## 2020-06-14 DIAGNOSIS — Z1211 Encounter for screening for malignant neoplasm of colon: Secondary | ICD-10-CM

## 2020-06-14 DIAGNOSIS — D649 Anemia, unspecified: Secondary | ICD-10-CM

## 2020-06-14 DIAGNOSIS — Z13220 Encounter for screening for lipoid disorders: Secondary | ICD-10-CM

## 2020-06-14 DIAGNOSIS — G8921 Chronic pain due to trauma: Secondary | ICD-10-CM

## 2020-06-14 DIAGNOSIS — R7309 Other abnormal glucose: Secondary | ICD-10-CM

## 2020-06-14 MED ORDER — METHOCARBAMOL 500 MG PO TABS: 500.0000 mg | ORAL_TABLET | Freq: Three times a day (TID) | ORAL | 1 refills | Status: AC | PRN

## 2020-06-14 MED ORDER — GABAPENTIN 300 MG PO CAPS: 300.0000 mg | ORAL_CAPSULE | Freq: Three times a day (TID) | ORAL | 3 refills | Status: AC

## 2020-06-14 MED ORDER — NAPROXEN 500 MG PO TABS: 500.0000 mg | ORAL_TABLET | Freq: Two times a day (BID) | ORAL | 1 refills | Status: AC
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
## 2020-06-15 LAB — LIPID PANEL
Chol/HDL Ratio: 2.5 ratio (ref 0.0–5.0)
Cholesterol, Total: 146 mg/dL (ref 100–199)
HDL: 59 mg/dL (ref >39)
LDL Chol Calc (NIH): 77 mg/dL (ref 0–99)
Triglycerides: 46 mg/dL (ref 0–149)
VLDL Cholesterol Cal: 10 mg/dL (ref 5–40)

## 2020-06-15 LAB — CBC
Hematocrit: 49.8 % (ref 37.5–51.0)
Hemoglobin: 15.4 g/dL (ref 13.0–17.7)
MCH: 24.4 pg — ABNORMAL LOW (ref 26.6–33.0)
MCHC: 30.9 g/dL — ABNORMAL LOW (ref 31.5–35.7)
MCV: 79 fL (ref 79–97)
Platelets: 297 10*3/uL (ref 150–450)
RBC: 6.31 x10E6/uL — ABNORMAL HIGH (ref 4.14–5.80)
RDW: 12.6 % (ref 11.6–15.4)
WBC: 8.2 10*3/uL (ref 3.4–10.8)

## 2020-06-15 LAB — CMP14+EGFR
ALT: 19 IU/L (ref 0–44)
AST: 22 IU/L (ref 0–40)
Albumin/Globulin Ratio: 2 (ref 1.2–2.2)
Albumin: 4 g/dL (ref 4.0–5.0)
Alkaline Phosphatase: 67 IU/L (ref 44–121)
BUN/Creatinine Ratio: 8 — ABNORMAL LOW (ref 9–20)
BUN: 9 mg/dL (ref 6–24)
Bilirubin Total: 0.6 mg/dL (ref 0.0–1.2)
CO2: 23 mmol/L (ref 20–29)
Calcium: 9.1 mg/dL (ref 8.7–10.2)
Chloride: 103 mmol/L (ref 96–106)
Creatinine, Ser: 1.19 mg/dL (ref 0.76–1.27)
GFR calc Af Amer: 84 mL/min/{1.73_m2} (ref >59)
GFR calc non Af Amer: 72 mL/min/{1.73_m2} (ref >59)
Globulin, Total: 2 g/dL (ref 1.5–4.5)
Glucose: 94 mg/dL (ref 65–99)
Potassium: 4.7 mmol/L (ref 3.5–5.2)
Sodium: 139 mmol/L (ref 134–144)
Total Protein: 6 g/dL (ref 6.0–8.5)

## 2020-06-15 LAB — HEMOGLOBIN A1C
Est. average glucose Bld gHb Est-mCnc: 82 mg/dL
Hgb A1c MFr Bld: 4.5 % — ABNORMAL LOW (ref 4.8–5.6)

## 2020-06-21 ENCOUNTER — Encounter: Payer: Self-pay | Admitting: Nurse Practitioner

## 2020-07-29 ENCOUNTER — Telehealth: Payer: Self-pay

## 2020-07-29 NOTE — Telephone Encounter (Signed)
Called pt to confirm virtual appointment for tomorrow no answer left VM for pt to call back.

## 2020-07-30 ENCOUNTER — Ambulatory Visit: Payer: Self-pay | Attending: Nurse Practitioner | Admitting: Nurse Practitioner

## 2020-07-30 ENCOUNTER — Other Ambulatory Visit: Payer: Self-pay

## 2020-07-30 ENCOUNTER — Encounter: Payer: Self-pay | Admitting: Nurse Practitioner

## 2020-07-30 DIAGNOSIS — M5416 Radiculopathy, lumbar region: Secondary | ICD-10-CM

## 2020-07-30 MED ORDER — METHOCARBAMOL 500 MG PO TABS: 500.0000 mg | ORAL_TABLET | Freq: Three times a day (TID) | ORAL | 3 refills | Status: AC | PRN

## 2020-07-30 NOTE — Progress Notes (Signed)
Pt also states having knee and shoulder pain as well.
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
## 2020-08-03 LAB — FECAL OCCULT BLOOD, IMMUNOCHEMICAL: Fecal Occult Bld: NEGATIVE

## 2020-10-29 ENCOUNTER — Ambulatory Visit: Payer: Medicaid Other | Admitting: Nurse Practitioner

## 2020-11-25 ENCOUNTER — Ambulatory Visit: Payer: Medicaid Other | Admitting: Physician Assistant

## 2022-08-30 ENCOUNTER — Encounter: Payer: BLUE CROSS/BLUE SHIELD | Admitting: Nurse Practitioner

## 2022-09-20 ENCOUNTER — Encounter: Payer: Medicaid Other | Admitting: Nurse Practitioner

## 2022-10-18 ENCOUNTER — Encounter: Payer: Medicaid Other | Admitting: Nurse Practitioner

## 2023-02-08 ENCOUNTER — Other Ambulatory Visit: Payer: Self-pay

## 2023-02-09 ENCOUNTER — Other Ambulatory Visit: Payer: Self-pay

## 2023-11-15 ENCOUNTER — Telehealth: Payer: Self-pay | Admitting: Nurse Practitioner

## 2023-11-15 NOTE — Telephone Encounter (Signed)
Contacted pt confirmed appt

## 2023-11-16 ENCOUNTER — Ambulatory Visit: Payer: Self-pay

## 2023-11-16 ENCOUNTER — Encounter: Admitting: Nurse Practitioner
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
